# Patient Record
Sex: Male | Born: 1965 | Race: Black or African American | Hispanic: No | Marital: Married | Smoking: Never smoker
Health system: Southern US, Community
[De-identification: ages and names within clinical notes are randomized; demographics above are authoritative.]

## PROBLEM LIST (undated history)

## (undated) DIAGNOSIS — K219 Gastro-esophageal reflux disease without esophagitis: Secondary | ICD-10-CM

## (undated) DIAGNOSIS — F209 Schizophrenia, unspecified: Secondary | ICD-10-CM

## (undated) DIAGNOSIS — E119 Type 2 diabetes mellitus without complications: Secondary | ICD-10-CM

## (undated) DIAGNOSIS — I10 Essential (primary) hypertension: Secondary | ICD-10-CM

## (undated) DIAGNOSIS — E785 Hyperlipidemia, unspecified: Secondary | ICD-10-CM

## (undated) HISTORY — DX: Schizophrenia, unspecified: F20.9

## (undated) HISTORY — DX: Essential (primary) hypertension: I10

## (undated) HISTORY — DX: Hyperlipidemia, unspecified: E78.5

## (undated) HISTORY — PX: NO PAST SURGERIES: SHX2092

## (undated) HISTORY — DX: Gastro-esophageal reflux disease without esophagitis: K21.9

---

## 2009-11-25 ENCOUNTER — Emergency Department (HOSPITAL_COMMUNITY): Admission: EM | Admit: 2009-11-25 | Discharge: 2009-11-25 | Payer: Self-pay | Admitting: Family Medicine

## 2010-01-09 ENCOUNTER — Ambulatory Visit (HOSPITAL_COMMUNITY): Payer: Self-pay | Admitting: Licensed Clinical Social Worker

## 2010-01-13 ENCOUNTER — Emergency Department (HOSPITAL_COMMUNITY): Admission: EM | Admit: 2010-01-13 | Discharge: 2010-01-13 | Payer: Self-pay | Admitting: Emergency Medicine

## 2010-01-16 ENCOUNTER — Ambulatory Visit (HOSPITAL_COMMUNITY): Payer: Self-pay | Admitting: Licensed Clinical Social Worker

## 2010-01-26 ENCOUNTER — Ambulatory Visit (HOSPITAL_COMMUNITY): Payer: Self-pay | Admitting: Psychiatry

## 2010-01-30 ENCOUNTER — Encounter: Payer: Self-pay | Admitting: Physician Assistant

## 2010-01-30 ENCOUNTER — Ambulatory Visit: Payer: Self-pay | Admitting: Family Medicine

## 2010-01-30 DIAGNOSIS — I10 Essential (primary) hypertension: Secondary | ICD-10-CM | POA: Insufficient documentation

## 2010-01-30 DIAGNOSIS — E785 Hyperlipidemia, unspecified: Secondary | ICD-10-CM

## 2010-01-30 DIAGNOSIS — E669 Obesity, unspecified: Secondary | ICD-10-CM

## 2010-01-30 DIAGNOSIS — T169XXA Foreign body in ear, unspecified ear, initial encounter: Secondary | ICD-10-CM | POA: Insufficient documentation

## 2010-02-01 ENCOUNTER — Encounter: Payer: Self-pay | Admitting: Physician Assistant

## 2010-02-01 LAB — CONVERTED CEMR LAB: Ferritin: 187 ng/mL (ref 22–322)

## 2010-02-02 ENCOUNTER — Encounter: Payer: Self-pay | Admitting: Physician Assistant

## 2010-02-02 ENCOUNTER — Ambulatory Visit (HOSPITAL_COMMUNITY): Payer: Self-pay | Admitting: Psychology

## 2010-02-02 LAB — CONVERTED CEMR LAB
HDL: 46 mg/dL (ref >39)
Hemoglobin: 12.6 g/dL — ABNORMAL LOW (ref 13.0–17.0)
LDL Cholesterol: 177 mg/dL — ABNORMAL HIGH (ref 0–99)
RBC: 5.69 M/uL (ref 4.22–5.81)
RDW: 16.4 % — ABNORMAL HIGH (ref 11.5–15.5)
Triglycerides: 77 mg/dL (ref <150)
WBC: 6.2 10*3/uL (ref 4.0–10.5)

## 2010-02-06 ENCOUNTER — Encounter: Payer: Self-pay | Admitting: Physician Assistant

## 2010-02-07 LAB — CONVERTED CEMR LAB
ALT: 37 units/L (ref 0–53)
Chloride: 86 meq/L — ABNORMAL LOW (ref 96–112)
Creatinine, Ser: 1.69 mg/dL — ABNORMAL HIGH (ref 0.40–1.50)
Total Protein: 8.5 g/dL — ABNORMAL HIGH (ref 6.0–8.3)

## 2010-03-08 ENCOUNTER — Ambulatory Visit: Payer: Self-pay | Admitting: Family Medicine

## 2010-03-08 DIAGNOSIS — D649 Anemia, unspecified: Secondary | ICD-10-CM | POA: Insufficient documentation

## 2010-03-08 DIAGNOSIS — K219 Gastro-esophageal reflux disease without esophagitis: Secondary | ICD-10-CM | POA: Insufficient documentation

## 2010-03-08 DIAGNOSIS — R7309 Other abnormal glucose: Secondary | ICD-10-CM | POA: Insufficient documentation

## 2010-03-09 LAB — CONVERTED CEMR LAB
Platelets: 386 10*3/uL (ref 150–400)
WBC: 5.8 10*3/uL (ref 4.0–10.5)

## 2010-03-13 ENCOUNTER — Ambulatory Visit (HOSPITAL_COMMUNITY): Payer: Self-pay | Admitting: Psychology

## 2010-05-06 ENCOUNTER — Encounter: Payer: Self-pay | Admitting: Family Medicine

## 2010-05-06 LAB — CONVERTED CEMR LAB: OCCULT 1: NEGATIVE

## 2010-05-07 ENCOUNTER — Encounter: Payer: Self-pay | Admitting: Family Medicine

## 2010-05-07 LAB — CONVERTED CEMR LAB: OCCULT 3: NEGATIVE

## 2010-05-08 ENCOUNTER — Encounter: Payer: Self-pay | Admitting: Family Medicine

## 2010-05-08 ENCOUNTER — Ambulatory Visit: Payer: Self-pay | Admitting: Family Medicine

## 2010-05-08 LAB — CONVERTED CEMR LAB: OCCULT 2: NEGATIVE

## 2010-05-10 ENCOUNTER — Encounter: Payer: Self-pay | Admitting: Family Medicine

## 2010-06-14 ENCOUNTER — Ambulatory Visit: Admit: 2010-06-14 | Payer: Self-pay | Admitting: Family Medicine

## 2010-06-16 ENCOUNTER — Encounter: Payer: Self-pay | Admitting: Family Medicine

## 2010-06-20 NOTE — Letter (Signed)
Summary: Letter  Letter   Imported By: Lind Guest 02/06/2010 14:03:08  _____________________________________________________________________  External Attachment:    Type:   Image     Comment:   External Document

## 2010-06-20 NOTE — Assessment & Plan Note (Signed)
Summary: office visit   Vital Signs:  Patient profile:   45 year old male Height:      67.75 inches Weight:      231.75 pounds BMI:     35.63 O2 Sat:      98 % on Room air Pulse rate:   86 / minute Resp:     16 per minute BP sitting:   140 / 100  (left arm)  Vitals Entered By: Mauricia Area CMA (March 08, 2010 9:35 AM)  Serial Vital Signs/Assessments:  Time      Position  BP       Pulse  Resp  Temp     By                     132/90                         Esperanza Sheets PA  CC: follow up Comments Did not bring meds   CC:  follow up.  History of Present Illness: Pt presents today for check up.  Hx of htn. Taking medications as prescribed and denies side effects.  No headache, chest pain or palpitations. Hx of Hyperlipidemia.  Taking medications as prescribed and no side effects. Not following a low fat diet. Not exercising regularly.  Eating fried foods.  Review recent labs.  No prev hx of anemia, per pt. Pt admits to frequent fluid brash when lying down.  Normal soft stools, but not every day.  No blood or melena. Denies: Headache, or dizziness. Chest pain or discomfort, palpitations, and peripheral edema. Cough or difficulty breathing. Abd pain,  and no change in bowel habits.        Allergies (verified): No Known Drug Allergies  Past History:  Past medical history reviewed for relevance to current acute and chronic problems.  Past Medical History: Reviewed history from 01/30/2010 and no changes required. Hyperlipidemia Hypertension Schizophrenia - Behavioral Health  Review of Systems      See HPI Psych:  Complains of mental problems.  Physical Exam  General:  Well-developed,well-nourished,in no acute distress; alert,appropriate and cooperative throughout examination Head:  Normocephalic and atraumatic without obvious abnormalities. No apparent alopecia or balding. Ears:  External ear exam shows no significant lesions or deformities.  Otoscopic  examination reveals clear canals, tympanic membranes are intact bilaterally without bulging, retraction, inflammation or discharge. Hearing is grossly normal bilaterally. Nose:  External nasal examination shows no deformity or inflammation. Nasal mucosa are pink and moist without lesions or exudates. Mouth:  Oral mucosa and oropharynx without lesions or exudates.  Teeth in good repair. Neck:  No deformities, masses, or tenderness noted. Lungs:  Normal respiratory effort, chest expands symmetrically. Lungs are clear to auscultation, no crackles or wheezes. Heart:  Normal rate and regular rhythm. S1 and S2 normal without gallop, murmur, click, rub or other extra sounds. Cervical Nodes:  No lymphadenopathy noted Psych:  Cognition and judgment appear intact. Alert and cooperative with normal attention span and concentration. No apparent delusions, illusions, hallucinations   Impression & Recommendations:  Problem # 1:  HYPERTENSION (ICD-401.9) Assessment Comment Only  The following medications were removed from the medication list:    Amlodipine Besylate 10 Mg Tabs (Amlodipine besylate) ..... One tab by mouth once daily His updated medication list for this problem includes:    Hydrochlorothiazide 25 Mg Tabs (Hydrochlorothiazide) ..... One tab by mouth once daily    Amlodipine Besy-benazepril Hcl 10-20 Mg  Caps (Amlodipine besy-benazepril hcl) .Marland Kitchen... Take one daily for high blood pressure  BP today: 140/100 Prior BP: 146/96 (01/30/2010)  Labs Reviewed: K+: TNP mEq/L (02/02/2010) Creat: : 1.69 (02/02/2010)   Chol: 238 (01/30/2010)   HDL: 46 (01/30/2010)   LDL: 177 (01/30/2010)   TG: 77 (01/30/2010)  Problem # 2:  PRE-DIABETES (ICD-790.29) Assessment: New  Discussed the importance of healthy diet, exercise and wt loss for prevention of progression to DM.  Orders: T-Comprehensive Metabolic Panel [id number]) T-Lipid Profile [phone]) T- Hemoglobin A1C ([id number])  Problem # 3:   HYPERLIPIDEMIA (ICD-272.4) Assessment: Comment Only Pt restarted Simvastatin.  Encouraged low fat diet, exercise, and wt loss.    His updated medication list for this problem includes:    Simvastatin 20 Mg Tabs (Simvastatin) ..... One tab by mouth at bedtime  Labs Reviewed: SGOT: 29 (02/02/2010)   SGPT: 37 (02/02/2010)   HDL:46 (01/30/2010)  LDL:177 (01/30/2010)  Chol:238 (01/30/2010)  Trig:77 (01/30/2010)  Problem # 4:  ANEMIA (ICD-285.9) Assessment: New  Recheck CBC today.  Hemocults x 3 ordered. Consider GI consult.  Orders: T-CBC No Diff ([id number])  Problem # 5:  GERD (ICD-530.81) Assessment: New  His updated medication list for this problem includes:    Omeprazole 20 Mg Cpdr (Omeprazole) .Marland Kitchen... Take one daily, 30 minutes before a meal, for reflux  Complete Medication List: 1)  Simvastatin 20 Mg Tabs (Simvastatin) .... One tab by mouth at bedtime 2)  Hydrochlorothiazide 25 Mg Tabs (Hydrochlorothiazide) .... One tab by mouth once daily 3)  Zyprexa 5 Mg Tabs (Olanzapine) 4)  Amlodipine Besy-benazepril Hcl 10-20 Mg Caps (Amlodipine besy-benazepril hcl) .... Take one daily for high blood pressure 5)  Omeprazole 20 Mg Cpdr (Omeprazole) .... Take one daily, 30 minutes before a meal, for reflux  Patient Instructions: 1)  Please schedule a follow-up appointment in 2 months. 2)  Stop taking Amlodipine.  Start the new prescription for Amlodipine-Benazepril as prescribed. 3)  I also prescribed Omeprazole for your stomach. 4)  Have lab work drawn fasting in 2 mos before your next appt. 5)  Complete stool samples as discussed. 6)  Eat healthier.  Low fat foods, and decrease your sweets.  Eat more fruits, veggies, and lean meats. 7)  It is important that you exercise regularly at least 20 minutes 5 times a week. If you develop chest pain, have severe difficulty breathing, or feel very tired , stop exercising immediately and seek medical attention. Prescriptions: OMEPRAZOLE 20 MG  CPDR (OMEPRAZOLE) take one daily, 30 minutes before a meal, for reflux  #30 x 3   Entered and Authorized by:   Esperanza Sheets PA   Signed by:   Esperanza Sheets PA on 03/08/2010   Method used:   Electronically to        [facility]  [address] (retail)       [address]       [place], [place]  [address]       Ph: [phone]       Fax: [phone]   RxID:   [id number] AMLODIPINE BESY-BENAZEPRIL HCL 10-20 MG CAPS (AMLODIPINE BESY-BENAZEPRIL HCL) take one daily for high blood pressure  #30 x 3   Entered and Authorized by:   Esperanza Sheets PA   Signed by:   Esperanza Sheets PA on 03/08/2010   Method used:   Electronically to        [facility]  [address] (retail)  [address]       [place], [place]  [address]       Ph: [phone]       Fax: [phone]   RxID:   [id number]    Orders Added: 1)  T-CBC No Diff [[id number]] 2)  T-Comprehensive Metabolic Panel [[id number]] 3)  T-Lipid Profile [80061-22930] 4)  T- Hemoglobin A1C [83036-23375] 5)  Est. Patient Level IV [[address]]

## 2010-06-20 NOTE — Assessment & Plan Note (Signed)
Summary: new patient- room 1   Vital Signs:  Patient profile:   45 year old male Height:      67.75 inches Weight:      228.50 pounds BMI:     35.13 O2 Sat:      99 % on Room air Pulse rate:   95 / minute Resp:     16 per minute BP sitting:   146 / 96  (left arm)  Vitals Entered By: Adella Hare LPN (January 30, 2010 1:31 PM)  Nutrition Counseling: Patient's BMI is greater than 25 and therefore counseled on weight management options. CC: new patient Is Patient Diabetic? No Pain Assessment Patient in pain? no        CC:  new patient.  History of Present Illness: New pt here to establish care with new PCP.  Hx of htn. Taking medications as prescribed until ran out couple days ago and denies side effects.  No headache, chest pain or palpitations. Hx of Hyperlipidemia.  Taking medications as prescribed until ran out couple days ago and no side effects.  Not following a low fat diet. Not exercising regularly. Recently started going to behavioral health.  Recently started Zyprexa 2.5 mg, and will be tapering up to 5 mg.  Last Td - first part of 2011. Pt had hematuria in the recent past.  Had CT done and saw urologist.       Current Medications (verified): 1)  Simvastatin 20 Mg Tabs (Simvastatin) .... One Tab By Mouth At Bedtime 2)  Hydrochlorothiazide 25 Mg Tabs (Hydrochlorothiazide) .... One Tab By Mouth Once Daily 3)  Amlodipine Besylate 10 Mg Tabs (Amlodipine Besylate) .... One Tab By Mouth Once Daily  Allergies (verified): No Known Drug Allergies  Past History:  Past medical, surgical, family and social histories (including risk factors) reviewed for relevance to current acute and chronic problems.  Past Medical History: Hyperlipidemia Hypertension Schizophrenia - Behavioral Health  Past Surgical History: none  Family History: Reviewed history and no changes required. Family History Psychiatric care father unknown  Social History: Reviewed history  and no changes required. 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 ENT:  Denies earache, nasal congestion, and sore throat. CV:  Denies chest pain or discomfort and palpitations. Resp:  Denies cough and shortness of breath. GI:  Denies abdominal pain, change in bowel habits, indigestion, nausea, and vomiting. GU:  Denies dysuria, hematuria, and urinary frequency. Neuro:  Denies headaches, numbness, and tremors. Psych:  Complains of mental problems.  Physical Exam  General:  Well-developed,well-nourished,in no acute distress; alert,appropriate and cooperative throughout examination Head:  Normocephalic and atraumatic without obvious abnormalities. No apparent alopecia or balding. Ears:  Rt EAC foreign body noted.  Removed with forceps a white platic cylindrical piece.  Bilat TMs neg. Nose:  External nasal examination shows no deformity or inflammation. Nasal mucosa are pink and moist without lesions or exudates. Mouth:  Oral mucosa and oropharynx without lesions or exudates.  Teeth in good repair. Neck:  No deformities, masses, or tenderness noted. Lungs:  Normal respiratory effort, chest expands symmetrically. Lungs are clear to auscultation, no crackles or wheezes. Heart:  Normal rate and regular rhythm. S1 and S2 normal without gallop, murmur, click, rub or other extra sounds. Abdomen:  Bowel sounds positive,abdomen soft and non-tender without  masses, organomegaly or hernias noted. Cervical Nodes:  No lymphadenopathy noted Psych:  Cognition and judgment appear intact. Alert and cooperative with normal attention span and concentration. No apparent delusions, illusions, hallucinations   Impression & Recommendations:  Problem # 1:  HYPERTENSION  (ICD-401.9) Assessment Deteriorated  His updated medication list for this problem includes:    Hydrochlorothiazide 25 Mg Tabs (Hydrochlorothiazide) ..... One tab by mouth once daily    Amlodipine Besylate 10 Mg Tabs (Amlodipine besylate) ..... One tab by mouth once daily  Orders: T-CMP with estimated GFR ([id number])  Problem # 2:  HYPERLIPIDEMIA (ICD-272.4) Assessment: Comment Only  His updated medication list for this problem includes:    Simvastatin 20 Mg Tabs (Simvastatin) ..... One tab by mouth at bedtime  Orders: T-Lipid Profile ([id number])  Problem # 3:  OBESITY (ICD-278.00) Assessment: Comment Only Encouraged exercise and wt loss.   Orders: T- Hemoglobin A1C ([id number])  Ht: 67.75 (01/30/2010)   Wt: 228.50 (01/30/2010)   BMI: 35.13 (01/30/2010)  Problem # 4:  FOREIGN BODY, EAR, RIGHT (ICD-931)  Orders: Clear Outer Ear canal ([address])  Complete Medication List: 1)  Simvastatin 20 Mg Tabs (Simvastatin) .... One tab by mouth at bedtime 2)  Hydrochlorothiazide 25 Mg Tabs (Hydrochlorothiazide) .... One tab by mouth once daily 3)  Amlodipine Besylate 10 Mg Tabs (Amlodipine besylate) .... One tab by mouth once daily 4)  Zyprexa 5 Mg Tabs (Olanzapine)  Other Orders: T-CBC No Diff ([id number]) T-TSH ([id number])  Patient Instructions: 1)  Please schedule a follow-up appointment in 1 month. 2)  Have blood work drawn fasting. 3)  I have refilled your medications for you. 4)  It is important that you exercise regularly at least 20 minutes 5 times a week. If you develop chest pain, have severe difficulty breathing, or feel very tired , stop exercising immediately and seek medical attention. 5)  You need to lose weight. Consider a lower calorie diet and regular exercise.  Prescriptions: AMLODIPINE BESYLATE 10 MG TABS (AMLODIPINE BESYLATE) one tab by mouth once daily  #30 x 2   Entered and Authorized by:   Esperanza Sheets PA   Signed by:   Esperanza Sheets PA on 01/30/2010    Method used:   Electronically to        [facility]  [address] (retail)       [address]       [place], [place]  [address]       Ph: [phone]       Fax: [phone]   RxID:   [id number] HYDROCHLOROTHIAZIDE 25 MG TABS (HYDROCHLOROTHIAZIDE) one tab by mouth once daily  #30 x 2   Entered and Authorized by:   Esperanza Sheets PA   Signed by:   Esperanza Sheets PA on 01/30/2010   Method used:   Electronically to        [facility]  [address] (retail)       [address]       [place], [place]  [address]       Ph: [phone]       Fax: [phone]   RxID:   [id number] SIMVASTATIN 20 MG TABS (SIMVASTATIN) one tab by mouth at bedtime  #30 x 2   Entered and Authorized by:   Esperanza Sheets PA   Signed by:   Esperanza Sheets PA on 01/30/2010   Method used:   Electronically to  [facility]  [address] [facility] (retail)       [address]       [place], [place]  [address]       Ph: [phone]       Fax: [phone]   RxID:   [id number]

## 2010-06-22 NOTE — Letter (Signed)
Summary: 1ST MISSED LETTER  1ST MISSED LETTER   Imported By: Lind Guest 06/16/2010 08:32:25  _____________________________________________________________________  External Attachment:    Type:   Image     Comment:   External Document

## 2010-06-22 NOTE — Miscellaneous (Signed)
Summary: stool cards  Clinical Lists Changes  Observations: Added new observation of HEMOCCULT 2: negative (05/08/2010 16:04) Added new observation of HEMOCCULT 3: negative (05/07/2010 16:04) Added new observation of HEMOCCULT 1: negative (05/06/2010 16:04)     Laboratory Results  Date/Time Received: May 10, 2010 4:04 PM  Date/Time Reported: May 10, 2010 4:04 PM   Stool - Occult Blood Hemmoccult #1: negative Date: 05/06/2010 Hemoccult #2: negative Date: 05/07/2010 Hemoccult #3: negative Date: 05/08/2010

## 2010-07-19 ENCOUNTER — Telehealth (INDEPENDENT_AMBULATORY_CARE_PROVIDER_SITE_OTHER): Payer: Self-pay | Admitting: *Deleted

## 2010-08-01 NOTE — Progress Notes (Signed)
Summary: referrel  Phone Note Call from Patient   Summary of Call: Dr. Lolly Mustache has dropped him is there anyone else he can go to  call back at  [phone]  or  [phone] buisness phone Initial call taken by: Lind Guest,  July 19, 2010 12:47 PM  Follow-up for Phone Call        [facility], or any psych office in [place], pls find out where he wants to go and I am putting in the referral Follow-up by: Syliva Overman MD,  July 19, 2010 12:54 PM  Additional Follow-up for Phone Call Additional follow up Details #1::        called pt and left message on phone for him to call office back Additional Follow-up by: Rudene Anda,  July 19, 2010 1:18 PM    Additional Follow-up for Phone Call Additional follow up Details #2::    Called pt and left a message again  Follow-up by: Rudene Anda,  July 21, 2010 8:43 AM

## 2010-08-06 LAB — POCT URINALYSIS DIP (DEVICE)
Glucose, UA: NEGATIVE mg/dL
Nitrite: NEGATIVE
Urobilinogen, UA: 0.2 mg/dL (ref 0.0–1.0)

## 2010-08-07 ENCOUNTER — Encounter: Payer: Self-pay | Admitting: Family Medicine

## 2010-08-08 ENCOUNTER — Ambulatory Visit: Payer: Self-pay | Admitting: Family Medicine

## 2010-08-08 ENCOUNTER — Encounter: Payer: Self-pay | Admitting: Family Medicine

## 2010-09-12 ENCOUNTER — Encounter: Payer: Self-pay | Admitting: Family Medicine

## 2010-09-13 ENCOUNTER — Encounter: Payer: Self-pay | Admitting: Family Medicine

## 2010-09-14 ENCOUNTER — Encounter: Payer: Self-pay | Admitting: Family Medicine

## 2010-09-14 ENCOUNTER — Ambulatory Visit (INDEPENDENT_AMBULATORY_CARE_PROVIDER_SITE_OTHER): Payer: 59 | Admitting: Family Medicine

## 2010-09-14 VITALS — BP 136/90 | HR 88 | Resp 16 | Ht 68.0 in | Wt 232.0 lb

## 2010-09-14 DIAGNOSIS — K3189 Other diseases of stomach and duodenum: Secondary | ICD-10-CM

## 2010-09-14 DIAGNOSIS — I10 Essential (primary) hypertension: Secondary | ICD-10-CM

## 2010-09-14 DIAGNOSIS — R1013 Epigastric pain: Secondary | ICD-10-CM

## 2010-09-14 DIAGNOSIS — Z79899 Other long term (current) drug therapy: Secondary | ICD-10-CM

## 2010-09-14 DIAGNOSIS — R7301 Impaired fasting glucose: Secondary | ICD-10-CM

## 2010-09-14 DIAGNOSIS — E785 Hyperlipidemia, unspecified: Secondary | ICD-10-CM

## 2010-09-14 DIAGNOSIS — Z125 Encounter for screening for malignant neoplasm of prostate: Secondary | ICD-10-CM

## 2010-09-14 DIAGNOSIS — F99 Mental disorder, not otherwise specified: Secondary | ICD-10-CM

## 2010-09-14 DIAGNOSIS — R7309 Other abnormal glucose: Secondary | ICD-10-CM

## 2010-09-14 DIAGNOSIS — F489 Nonpsychotic mental disorder, unspecified: Secondary | ICD-10-CM

## 2010-09-14 MED ORDER — OMEPRAZOLE 20 MG PO CPDR: 20.0000 mg | DELAYED_RELEASE_CAPSULE | Freq: Every day | ORAL | Status: DC

## 2010-09-14 MED ORDER — AMLODIPINE BESY-BENAZEPRIL HCL 10-20 MG PO CAPS: 1.0000 | ORAL_CAPSULE | Freq: Every day | ORAL | Status: DC

## 2010-09-14 MED ORDER — HYDROCHLOROTHIAZIDE 25 MG PO TABS: 25.0000 mg | ORAL_TABLET | Freq: Every day | ORAL | Status: DC

## 2010-09-14 MED ORDER — PRAVASTATIN SODIUM 40 MG PO TABS: 40.0000 mg | ORAL_TABLET | Freq: Every evening | ORAL | Status: DC

## 2010-09-14 NOTE — Patient Instructions (Signed)
F/u in 2 months.  A healthy diet is rich in fruit, vegetables and whole grains. Poultry fish, nuts and beans are a healthy choice for protein rather then red meat. A low sodium diet and drinking 64 ounces of water daily is generally recommended. Oils and sweet should be limited. Carbohydrates especially for those who are diabetic or overweight, should be limited to 34-45 gram per meal. It is important to eat on a regular schedule, at least 3 times daily. Snacks should be primarily fruits, vegetables or nuts. It is important that you exercise regularly at least 30 minutes 5 times a week. If you develop chest pain, have severe difficulty breathing, or feel very tired, stop exercising immediately and seek medical attention  Fasting labs in 2 months.  EKG, CCUA today.  TdAP today  Meds as discussed pls take as prescribed

## 2010-09-15 LAB — POCT URINALYSIS DIPSTICK
Blood, UA: NEGATIVE
Nitrite, UA: NEGATIVE
Protein, UA: NEGATIVE
WBC, UA: NEGATIVE
pH, UA: 5.5

## 2010-09-18 ENCOUNTER — Encounter: Payer: Self-pay | Admitting: Family Medicine

## 2010-09-25 ENCOUNTER — Encounter: Payer: Self-pay | Admitting: Family Medicine

## 2010-09-25 DIAGNOSIS — F99 Mental disorder, not otherwise specified: Secondary | ICD-10-CM | POA: Insufficient documentation

## 2010-09-25 NOTE — Assessment & Plan Note (Signed)
Deteriorate by history , will refer for psych eval and treatment, not currently a threat to self and anyone

## 2010-09-25 NOTE — Assessment & Plan Note (Signed)
Medication compliance addressed. Commitment to regular exercise and healthy  food choices, with portion control discussed. DASH diet and low fat diet discussed and literature offered. Changes in medication made at this visit.  

## 2010-09-25 NOTE — Progress Notes (Signed)
  Subjective:    Patient ID: Alex Holt, male    DOB: Nov 03, 1965, 45 y.o.   MRN: [id number]  HPI The PT is here for follow up and re-evaluation of chronic medical conditions and  medication management  There are no new concerns.He does request referral for mental health illness. He is not suicidal or homicidal, but states he has a personal and fam h/o mental illness and is aware he needs medciation  There are no specific complaints       Review of Systems Denies recent fever or chills. Denies sinus pressure, nasal congestion, ear pain or sore throat. Denies chest congestion, productive cough or wheezing. Denies chest pains, palpitations, paroxysmal nocturnal dyspnea, orthopnea and leg swelling Denies abdominal pain, nausea, vomiting,diarrhea or constipation.  Denies rectal bleeding or change in bowel movement. Denies dysuria, frequency, hesitancy or incontinence. Denies joint pain, swelling and limitation in mobility. Denies headaches, seizure, numbness, or tingling.  Denies skin break down or rash.        Objective:   Physical Exam Patient alert and oriented and in no Cardiopulmonary distress.  HEENT: No facial asymmetry, EOMI, no sinus tenderness, TM's clear, Oropharynx pink and moist.  Neck supple no adenopathy.  Chest: Clear to auscultation bilaterally.  CVS: S1, S2 no murmurs, no S3.  ABD: Soft non tender. Bowel sounds normal.  Ext: No edema  MS: Adequate ROM spine, shoulders, hips and knees.  Skin: Intact, no ulcerations or rash noted.  Psych: Good eye contact, . Memory intact not anxious or depressed appearing.  CNS: CN 2-12 intact, power, tone and sensation normal throughout.        Assessment & Plan:

## 2010-09-25 NOTE — Assessment & Plan Note (Signed)
The importance of dietary change , reg exercise to reduce the risk of developing diabetes was dicussed and stressed

## 2010-09-25 NOTE — Assessment & Plan Note (Signed)
Uncontrolled, low fat diet discussed, labs to be updated

## 2010-11-13 ENCOUNTER — Encounter: Payer: Self-pay | Admitting: Family Medicine

## 2010-11-14 ENCOUNTER — Ambulatory Visit (INDEPENDENT_AMBULATORY_CARE_PROVIDER_SITE_OTHER): Payer: 59 | Admitting: Family Medicine

## 2010-11-14 ENCOUNTER — Encounter: Payer: Self-pay | Admitting: Family Medicine

## 2010-11-14 VITALS — BP 120/88 | HR 111 | Resp 16 | Ht 67.0 in | Wt 229.0 lb

## 2010-11-14 DIAGNOSIS — E785 Hyperlipidemia, unspecified: Secondary | ICD-10-CM

## 2010-11-14 DIAGNOSIS — G4700 Insomnia, unspecified: Secondary | ICD-10-CM | POA: Insufficient documentation

## 2010-11-14 DIAGNOSIS — F99 Mental disorder, not otherwise specified: Secondary | ICD-10-CM

## 2010-11-14 DIAGNOSIS — E1169 Type 2 diabetes mellitus with other specified complication: Secondary | ICD-10-CM

## 2010-11-14 DIAGNOSIS — I10 Essential (primary) hypertension: Secondary | ICD-10-CM

## 2010-11-14 DIAGNOSIS — E119 Type 2 diabetes mellitus without complications: Secondary | ICD-10-CM

## 2010-11-14 DIAGNOSIS — A048 Other specified bacterial intestinal infections: Secondary | ICD-10-CM

## 2010-11-14 DIAGNOSIS — F489 Nonpsychotic mental disorder, unspecified: Secondary | ICD-10-CM

## 2010-11-14 DIAGNOSIS — E669 Obesity, unspecified: Secondary | ICD-10-CM

## 2010-11-14 LAB — LIPID PANEL
Cholesterol: 216 mg/dL — ABNORMAL HIGH (ref 0–200)
LDL Cholesterol: 143 mg/dL — ABNORMAL HIGH (ref 0–99)
Total CHOL/HDL Ratio: 3.5 Ratio
VLDL: 12 mg/dL (ref 0–40)

## 2010-11-14 LAB — BASIC METABOLIC PANEL
CO2: 24 mEq/L (ref 19–32)
Calcium: 9.9 mg/dL (ref 8.4–10.5)
Chloride: 100 mEq/L (ref 96–112)
Glucose, Bld: 91 mg/dL (ref 70–99)
Potassium: 4.6 mEq/L (ref 3.5–5.3)
Sodium: 138 mEq/L (ref 135–145)

## 2010-11-14 LAB — HEPATIC FUNCTION PANEL
AST: 24 U/L (ref 0–37)
Albumin: 4.4 g/dL (ref 3.5–5.2)
Alkaline Phosphatase: 58 U/L (ref 39–117)
Total Protein: 7.6 g/dL (ref 6.0–8.3)

## 2010-11-14 LAB — H. PYLORI ANTIBODY, IGG: H Pylori IgG: >8 {ISR} — ABNORMAL HIGH

## 2010-11-14 MED ORDER — METFORMIN HCL 500 MG PO TABS: 500.0000 mg | ORAL_TABLET | Freq: Two times a day (BID) | ORAL | Status: DC

## 2010-11-14 MED ORDER — PRAVASTATIN SODIUM 80 MG PO TABS: 80.0000 mg | ORAL_TABLET | Freq: Every evening | ORAL | Status: DC

## 2010-11-14 MED ORDER — TEMAZEPAM 15 MG PO CAPS: 15.0000 mg | ORAL_CAPSULE | Freq: Every evening | ORAL | Status: AC | PRN

## 2010-11-14 MED ORDER — GLUCOSE BLOOD VI STRP: ORAL_STRIP | Status: AC

## 2010-11-14 MED ORDER — ACCU-CHEK MULTICLIX LANCETS MISC: Status: AC

## 2010-11-14 NOTE — Patient Instructions (Signed)
F/u in 3 months.  You are now a diabetic, start metformin 500mg  one twice daily. You need to start testing your sugar once daily, pls gt to group class and also we will give you a meter.   The pravastatin dose is being increased to 80mg  one at bedtime, since your cholesterl is still too high. oK to take TWO 40mg  tabs of this till done  You will be referred to mental health.  New medication at bedtime to help with sleep

## 2010-11-17 MED ORDER — AMOXICILL-CLARITHRO-LANSOPRAZ PO MISC: ORAL | Status: DC

## 2010-11-17 NOTE — Progress Notes (Signed)
Addended by: Abner Greenspan on: 11/17/2010 01:52 PM   Modules accepted: Orders

## 2010-11-19 NOTE — Progress Notes (Signed)
  Subjective:    Patient ID: Alex Holt, male    DOB: September 14, 1965, 45 y.o.   MRN: [id number]  HPI The PT is here for follow up and re-evaluation of chronic medical conditions, medication management and review of recent lab and radiology data.  Preventive health is updated, specifically  Cancer screening, Osteoporosis screening and Immunization.   Questions or concerns regarding consultations or procedures which the PT has had in the interim are  addressed. The PT denies any adverse reactions to current medications since the last visit.  He still hs not been seen by psychiatry and wants this done since there is a family h/o mental illness, and he believes he also has some degree of illness. He c/o significant problems falling and staying asleep despite education re good sleep hygiene , requests medication for this    Review of Systems Denies recent fever or chills. Denies sinus pressure, nasal congestion, ear pain or sore throat. Denies chest congestion, productive cough or wheezing. Denies chest pains, palpitations, paroxysmal nocturnal dyspnea, orthopnea and leg swelling Intermittent abddominal pain, denies  nausea, vomiting,diarrhea or constipation.  Denies rectal bleeding or change in bowel movement. Denies dysuria, frequency, hesitancy or incontinence. Denies joint pain, swelling and limitation in mobility. Denies headaches, seizure, numbness, or tingling. . Denies skin break down or rash.        Objective:   Physical Exam Patient alert and oriented and in no Cardiopulmonary distress.  HEENT: No facial asymmetry, EOMI, no sinus tenderness, TM's clear, Oropharynx pink and moist.  Neck supple no adenopathy.  Chest: Clear to auscultation bilaterally.  CVS: S1, S2 no murmurs, no S3.  ABD: Soft non tender. Bowel sounds normal.  Ext: No edema  MS: Adequate ROM spine, shoulders, hips and knees.  Skin: Intact, no ulcerations or rash noted.  Psych: Good eye contact, normal   affect. Memory intact. CNS: CN 2-12 intact, power, tone and sensation normal throughout.        Assessment & Plan:

## 2010-11-20 DIAGNOSIS — A048 Other specified bacterial intestinal infections: Secondary | ICD-10-CM | POA: Insufficient documentation

## 2010-11-20 NOTE — Assessment & Plan Note (Signed)
Low fat diet discussed and pt to take med

## 2010-11-20 NOTE — Assessment & Plan Note (Signed)
Deteriorated, needs to start medication

## 2010-11-20 NOTE — Assessment & Plan Note (Signed)
Untreated and uncontrolled, pt not suicidal or homicidal, wants to see psych, will need to schedule his appt

## 2010-11-20 NOTE — Assessment & Plan Note (Signed)
Lab report came in after visit, pt notified and med prescribed

## 2010-11-20 NOTE — Assessment & Plan Note (Signed)
Unchanged, pt to start medication

## 2011-02-09 ENCOUNTER — Encounter: Payer: Self-pay | Admitting: Family Medicine

## 2011-02-12 ENCOUNTER — Encounter: Payer: Self-pay | Admitting: Family Medicine

## 2011-02-12 ENCOUNTER — Ambulatory Visit: Payer: 59 | Admitting: Family Medicine

## 2012-04-07 ENCOUNTER — Emergency Department (INDEPENDENT_AMBULATORY_CARE_PROVIDER_SITE_OTHER)
Admission: EM | Admit: 2012-04-07 | Discharge: 2012-04-07 | Disposition: A | Discharge status: Home / Self Care | Payer: 59 | Source: Home / Self Care | Attending: Family Medicine | Admitting: Family Medicine

## 2012-04-07 ENCOUNTER — Encounter (HOSPITAL_COMMUNITY): Payer: Self-pay | Admitting: Emergency Medicine

## 2012-04-07 ENCOUNTER — Emergency Department (INDEPENDENT_AMBULATORY_CARE_PROVIDER_SITE_OTHER): Payer: 59

## 2012-04-07 DIAGNOSIS — M79673 Pain in unspecified foot: Secondary | ICD-10-CM

## 2012-04-07 DIAGNOSIS — M79609 Pain in unspecified limb: Secondary | ICD-10-CM

## 2012-04-07 DIAGNOSIS — M722 Plantar fascial fibromatosis: Secondary | ICD-10-CM

## 2012-04-07 HISTORY — DX: Type 2 diabetes mellitus without complications: E11.9

## 2012-04-07 MED ORDER — ACYCLOVIR 400 MG PO TABS: 400.0000 mg | ORAL_TABLET | Freq: Three times a day (TID) | ORAL | Status: DC

## 2012-04-07 MED ORDER — TRAMADOL HCL 50 MG PO TABS: 50.0000 mg | ORAL_TABLET | Freq: Four times a day (QID) | ORAL | Status: DC | PRN

## 2012-04-07 NOTE — ED Provider Notes (Signed)
History     CSN: [id number]  Arrival date & time 04/07/12  1427   None     Chief Complaint  Patient presents with  . Foot Pain    (Consider location/radiation/quality/duration/timing/severity/associated sxs/prior treatment) Patient is a 46 y.o. male presenting with lower extremity pain and mouth sores. The history is provided by the patient.  Foot Pain This is a recurrent problem. The current episode started more than 1 week ago. The problem occurs daily. The problem has been gradually worsening. The symptoms are aggravated by walking. Nothing relieves the symptoms. He has tried nothing for the symptoms.  Mouth Lesions  The current episode started more than 2 weeks ago. The onset was gradual. The problem occurs frequently. Progression since onset: waxing and waning. The problem is mild. The symptoms are relieved by one or more prescription drugs (taking someone elses valtrex). Nothing aggravates the symptoms. Associated symptoms include mouth sores. Pertinent negatives include no fever. He has been eating and drinking normally. There were no sick contacts. He has received no recent medical care.    Past Medical History  Diagnosis Date  . Hyperlipidemia   . Hypertension   . Schizophrenia   . Diabetes mellitus without complication     History reviewed. No pertinent past surgical history.  Family History  Problem Relation Age of Onset  . Mental illness      FAMILY HISTORY   . Mental illness Mother     History  Substance Use Topics  . Smoking status: Never Smoker   . Smokeless tobacco: Not on file  . Alcohol Use: Not on file     Comment: WEEKENDS       Review of Systems  Constitutional: Negative for fever.  HENT: Positive for mouth sores.   Musculoskeletal: Positive for arthralgias.  All other systems reviewed and are negative.    Allergies  Review of patient's allergies indicates no known allergies.  Home Medications   Current Outpatient Rx  Name  Route   Sig  Dispense  Refill  . ACYCLOVIR 400 MG PO TABS   Oral   Take 1 tablet (400 mg total) by mouth 3 (three) times daily. For seven days, then repeat with each occurrence.   50 tablet   1   . AMLODIPINE BESY-BENAZEPRIL HCL 10-20 MG PO CAPS   Oral   Take 1 capsule by mouth daily.   30 capsule   3   . AMOXICILL-CLARITHRO-LANSOPRAZ PO MISC      Follow package directions.   1 kit   0   . HYDROCHLOROTHIAZIDE 25 MG PO TABS   Oral   Take 1 tablet (25 mg total) by mouth daily. TAKE ONE TABLET DAILY FOR HIGH BLOOD PRESSURE   30 tablet   3   . METFORMIN HCL 500 MG PO TABS   Oral   Take 1 tablet (500 mg total) by mouth 2 (two) times daily with a meal.   60 tablet   11   . OMEPRAZOLE 20 MG PO CPDR   Oral   Take 1 capsule (20 mg total) by mouth daily. TAKE ONE TABLET DAILY, 30 MINUTES BEFORE A MEAL, FOR REFLUX   30 capsule   3   . PRAVASTATIN SODIUM 80 MG PO TABS   Oral   Take 1 tablet (80 mg total) by mouth every evening. Dose increase   30 tablet   4   . TRAMADOL HCL 50 MG PO TABS   Oral   Take 1 tablet (50  mg total) by mouth every 6 (six) hours as needed for pain.   30 tablet   0     BP 164/98  Pulse 70  Temp 98.2 F (36.8 C) (Oral)  Resp 18  SpO2 100%  Physical Exam  Nursing note and vitals reviewed. Constitutional: He is oriented to person, place, and time. Vital signs are normal. He appears well-developed and well-nourished. He is active and cooperative.  HENT:  Head: Normocephalic.  Mouth/Throat: Uvula is midline, oropharynx is clear and moist and mucous membranes are normal. No oral lesions.  Eyes: Conjunctivae normal are normal. Pupils are equal, round, and reactive to light. No scleral icterus.  Neck: Trachea normal and normal range of motion. Neck supple.  Cardiovascular: Normal rate and regular rhythm.   Pulmonary/Chest: Effort normal and breath sounds normal.  Musculoskeletal:       Right lower leg: Normal.       Right foot: He exhibits tenderness.  He exhibits normal range of motion, no bony tenderness, no swelling, normal capillary refill, no crepitus, no deformity and no laceration.       Left foot: Normal.       Feet:  Lymphadenopathy:    He has no cervical adenopathy.  Neurological: He is alert and oriented to person, place, and time. No cranial nerve deficit or sensory deficit.  Skin: Skin is warm and dry.  Psychiatric: He has a normal mood and affect. His speech is normal and behavior is normal. Judgment and thought content normal. Cognition and memory are normal.    ED Course  Procedures (including critical care time)   Labs Reviewed  HSV 2 ANTIBODY, IGG  HSV 1 ANTIBODY, IGG   Dg Foot Complete Right  04/07/2012  *RADIOLOGY REPORT*  Clinical Data: Right hip pain for 1 week.  No injury.  RIGHT FOOT COMPLETE - 3+ VIEW  Comparison: None.  Findings: There is no evidence of fracture or dislocation.  There is no evidence of arthropathy or other focal bony abnormality. Soft tissues are unremarkable. Slight plantar spur. Early Achilles spur.  IMPRESSION: Slight plantar spur. Early Achilles spur.  No acute fracture or dislocation.   Original Report Authenticated By: Davonna Belling, M.D.      1. Plantar fasciitis of right foot   2. Heel pain       MDM  Medications as prescribed, heel support, follow up with primary care provider.         Johnsie Kindred, NP 04/07/12 1723

## 2012-04-07 NOTE — ED Notes (Addendum)
Pt here for multiple complaints: Pt c/o heel pain for about a week that has not gotten better. Pt states he has no knowledge of any injury. Pt states it hurts worse in the AM, and heat helped relieve the pain some. Pt did not take any OTC meds to see if it would help. Pt also c/o recurrent cold sores around his mouth.

## 2012-04-08 LAB — HSV 2 ANTIBODY, IGG: HSV 2 Glycoprotein G Ab, IgG: 10.86 IV — ABNORMAL HIGH

## 2012-04-08 LAB — HSV 1 ANTIBODY, IGG: HSV 1 Glycoprotein G Ab, IgG: 10.55 IV — ABNORMAL HIGH

## 2012-04-08 NOTE — ED Provider Notes (Signed)
Medical screening examination/treatment/procedure(s) were performed by resident physician or non-physician practitioner and as supervising physician I was immediately available for consultation/collaboration.   Barkley Bruns MD.    Linna Hoff, MD 04/08/12 1045

## 2012-04-10 ENCOUNTER — Other Ambulatory Visit: Payer: Self-pay | Admitting: Family Medicine

## 2012-04-10 ENCOUNTER — Encounter: Payer: Self-pay | Admitting: Family Medicine

## 2012-04-10 ENCOUNTER — Ambulatory Visit (INDEPENDENT_AMBULATORY_CARE_PROVIDER_SITE_OTHER): Payer: 59 | Admitting: Family Medicine

## 2012-04-10 VITALS — BP 144/86 | HR 88 | Resp 18 | Ht 67.0 in | Wt 225.0 lb

## 2012-04-10 DIAGNOSIS — E119 Type 2 diabetes mellitus without complications: Secondary | ICD-10-CM

## 2012-04-10 DIAGNOSIS — I10 Essential (primary) hypertension: Secondary | ICD-10-CM

## 2012-04-10 DIAGNOSIS — E1169 Type 2 diabetes mellitus with other specified complication: Secondary | ICD-10-CM

## 2012-04-10 DIAGNOSIS — E785 Hyperlipidemia, unspecified: Secondary | ICD-10-CM

## 2012-04-10 DIAGNOSIS — M722 Plantar fascial fibromatosis: Secondary | ICD-10-CM

## 2012-04-10 DIAGNOSIS — E669 Obesity, unspecified: Secondary | ICD-10-CM

## 2012-04-10 LAB — COMPREHENSIVE METABOLIC PANEL
ALT: 15 U/L (ref 0–53)
Albumin: 4.5 g/dL (ref 3.5–5.2)
CO2: 25 mEq/L (ref 19–32)
Calcium: 10 mg/dL (ref 8.4–10.5)
Chloride: 103 mEq/L (ref 96–112)
Creat: 1 mg/dL (ref 0.50–1.35)
Potassium: 4.2 mEq/L (ref 3.5–5.3)
Sodium: 138 mEq/L (ref 135–145)
Total Protein: 7.2 g/dL (ref 6.0–8.3)

## 2012-04-10 LAB — CBC
MCHC: 32.5 g/dL (ref 30.0–36.0)
Platelets: 360 10*3/uL (ref 150–400)
RDW: 15.2 % (ref 11.5–15.5)
WBC: 5.9 10*3/uL (ref 4.0–10.5)

## 2012-04-10 LAB — HEMOGLOBIN A1C
Hgb A1c MFr Bld: 6.2 % — ABNORMAL HIGH (ref <5.7)
Mean Plasma Glucose: 131 mg/dL — ABNORMAL HIGH (ref <117)

## 2012-04-10 LAB — LIPID PANEL: Cholesterol: 225 mg/dL — ABNORMAL HIGH (ref 0–200)

## 2012-04-10 MED ORDER — HYDROCHLOROTHIAZIDE 25 MG PO TABS: 25.0000 mg | ORAL_TABLET | Freq: Every day | ORAL | Status: DC

## 2012-04-10 NOTE — Assessment & Plan Note (Signed)
Check FLP then decide on statin therapy

## 2012-04-10 NOTE — Assessment & Plan Note (Addendum)
Advised heel cups, he would like referral to ortho per ER, refer to Dr. Romeo Apple at request Advised NSAIDS, ICE RUB

## 2012-04-10 NOTE — Assessment & Plan Note (Signed)
Restart HCTZ once a day, check fasting labs

## 2012-04-10 NOTE — Progress Notes (Signed)
  Subjective:    Patient ID: Alex Holt, male    DOB: 1965-08-04, 46 y.o.   MRN: [id number]  HPI  Patient here to reestablish care. She's not been seen since June of 2012. He has a history of hypertension, hyperlipidemia and diabetes mellitus. He's only been taking hydrochlorothiazide at random times this is was last prescribed him over a year ago with only 3 refills. He was seen in the emergency room recently for plantar fasciitis and needs a referral to orthopedic surgery. 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 He denies any chest pain, dizziness, headache. He was concerned about his insurance going up from having comorbidities as well as side effects medications which is why he did not followup on a routine basis.  Review of Systems  GEN- denies fatigue, fever, weight loss,weakness, recent illness HEENT- denies eye drainage, change in vision, nasal discharge, CVS- denies chest pain, palpitations RESP- denies SOB, cough, wheeze ABD- denies N/V, change in stools, abd pain GU- denies dysuria, hematuria, dribbling, incontinence MSK- denies joint pain, muscle aches, injury, +heel pain Neuro- denies headache, dizziness, syncope, seizure activity      Objective:   Physical Exam GEN- NAD, alert and oriented x3 , repeat 152/90,obese HEENT- PERRL, EOMI, non injected sclera, pink conjunctiva, MMM, oropharynx clear Neck- Supple, no thryomegaly CVS- RRR, no murmur RESP-CTAB ABD-NABS,soft,NT,ND EXT- No edema Pulses- Radial, DP- 2+ Psych-normal affect and mood       Assessment & Plan:

## 2012-04-10 NOTE — Assessment & Plan Note (Signed)
Pt took metformin x 1 month, check A1C to decide treatment, he has some adversity to meds due to potential side effects

## 2012-04-10 NOTE — Assessment & Plan Note (Signed)
Discussed diet and exercise 

## 2012-04-10 NOTE — Patient Instructions (Addendum)
Get the fasting labs  Start the hydrochlorothiazide for your blood pressure Work on the weight loss , this will help your overall health Start aleve twice a day as needed  Use the heel cups Use ice  REferral to Dr. Romeo Apple  F/U Monday for blood work and blood pressure

## 2012-04-11 ENCOUNTER — Telehealth: Payer: Self-pay | Admitting: Family Medicine

## 2012-04-11 ENCOUNTER — Other Ambulatory Visit: Payer: Self-pay

## 2012-04-11 MED ORDER — HYDROCHLOROTHIAZIDE 25 MG PO TABS: 25.0000 mg | ORAL_TABLET | Freq: Every day | ORAL | Status: DC

## 2012-04-11 NOTE — Telephone Encounter (Signed)
Orders written for heel cups

## 2012-04-11 NOTE — Telephone Encounter (Signed)
Do you anything about arches that were supposed to be sent in to [facility]?

## 2012-04-14 ENCOUNTER — Ambulatory Visit (INDEPENDENT_AMBULATORY_CARE_PROVIDER_SITE_OTHER): Payer: 59 | Admitting: Family Medicine

## 2012-04-14 ENCOUNTER — Encounter: Payer: Self-pay | Admitting: Family Medicine

## 2012-04-14 ENCOUNTER — Telehealth (HOSPITAL_COMMUNITY): Payer: Self-pay | Admitting: *Deleted

## 2012-04-14 VITALS — BP 130/82 | HR 90 | Resp 15 | Ht 67.0 in | Wt 226.0 lb

## 2012-04-14 DIAGNOSIS — E1169 Type 2 diabetes mellitus with other specified complication: Secondary | ICD-10-CM

## 2012-04-14 DIAGNOSIS — D649 Anemia, unspecified: Secondary | ICD-10-CM

## 2012-04-14 DIAGNOSIS — I10 Essential (primary) hypertension: Secondary | ICD-10-CM

## 2012-04-14 DIAGNOSIS — E669 Obesity, unspecified: Secondary | ICD-10-CM

## 2012-04-14 DIAGNOSIS — E119 Type 2 diabetes mellitus without complications: Secondary | ICD-10-CM

## 2012-04-14 DIAGNOSIS — E785 Hyperlipidemia, unspecified: Secondary | ICD-10-CM

## 2012-04-14 MED ORDER — METFORMIN HCL ER 500 MG PO TB24: 500.0000 mg | ORAL_TABLET | Freq: Every day | ORAL | Status: DC

## 2012-04-14 MED ORDER — SIMVASTATIN 20 MG PO TABS: 20.0000 mg | ORAL_TABLET | Freq: Every evening | ORAL | Status: DC

## 2012-04-14 NOTE — Assessment & Plan Note (Addendum)
Noted in chart microcytic anemia, will add iron panel, advised to eat iron rich foods, no colonoscopy history, no bleeding

## 2012-04-14 NOTE — ED Notes (Signed)
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  Alex Holt has reviewed the results and said she treated with Acyclovir.  Just notify pt.  I called and left a message to call. Vassie Moselle 04/14/2012

## 2012-04-14 NOTE — Assessment & Plan Note (Signed)
Goal LDL < 100, start zocor 20mg  at bedtime

## 2012-04-14 NOTE — Assessment & Plan Note (Signed)
Much improved, continue HCTZ 

## 2012-04-14 NOTE — Patient Instructions (Signed)
Blood pressure looks good continue HCTZ For your diabetes take the metformin once a day  New cholesterol medication at bedtime We will call about referral to Dr. Romeo Apple  F/U 3 months

## 2012-04-14 NOTE — Assessment & Plan Note (Signed)
A1C actually looks pretty good, will have him continue metformin 500mg  XL, he is dedicated to improving his health and losing weight He will need ACEI at next visit, I am trying to limit starting too many meds as he was non compliant before and felt overwhelmed and skeptic about meds

## 2012-04-14 NOTE — Progress Notes (Signed)
  Subjective:    Patient ID: Alex Holt, male    DOB: 1966/01/17, 46 y.o.   MRN: [id number]  HPI Patient here to followup labs and blood pressure. He was seen last week after reestablishing care. He was restarted on hydrochlorothiazide 25 mg once a day has not had any problems with this. His labs were reviewed.   Review of Systems - per above      Objective:   Physical Exam GEN-NAD,alert and oriented x3       Assessment & Plan:

## 2012-04-15 ENCOUNTER — Telehealth (HOSPITAL_COMMUNITY): Payer: Self-pay | Admitting: *Deleted

## 2012-04-15 NOTE — ED Notes (Signed)
Pt. called back.  Pt. verified x 2 and given results.  Pt. told he was adequately treated with Acyclovir.  Pt. instructed to notify his partner. You can pass the virus even when you don't have an outbreak, so always practice safe sex. Get treated for each outbreak with Acyclovir or Valtrex. You may want to get a PCP who can call in a prescription for you when you have an outbreak or get a 1 yr. Rx. that he can refill as needed.  Pt. said he does have a PCP. Pt. instructed to notify him.  Pt. voiced understanding. Vassie Moselle 04/15/2012

## 2012-04-22 LAB — ANEMIA PANEL
ABS Retic: 49.2 10*3/uL (ref 19.0–186.0)
RBC.: 5.47 MIL/uL (ref 4.22–5.81)
Retic Ct Pct: 0.9 % (ref 0.4–2.3)
TIBC: 420 ug/dL (ref 215–435)
UIBC: 374 ug/dL (ref 125–400)

## 2012-04-24 ENCOUNTER — Telehealth: Payer: Self-pay | Admitting: Family Medicine

## 2012-04-29 ENCOUNTER — Ambulatory Visit (INDEPENDENT_AMBULATORY_CARE_PROVIDER_SITE_OTHER): Payer: 59 | Admitting: Orthopedic Surgery

## 2012-04-29 ENCOUNTER — Encounter: Payer: Self-pay | Admitting: Orthopedic Surgery

## 2012-04-29 VITALS — BP 180/110 | Ht 66.0 in | Wt 228.0 lb

## 2012-04-29 DIAGNOSIS — G5600 Carpal tunnel syndrome, unspecified upper limb: Secondary | ICD-10-CM

## 2012-04-29 DIAGNOSIS — M722 Plantar fascial fibromatosis: Secondary | ICD-10-CM

## 2012-04-29 MED ORDER — TRAMADOL HCL 50 MG PO TABS: 50.0000 mg | ORAL_TABLET | Freq: Four times a day (QID) | ORAL | Status: DC | PRN

## 2012-04-29 NOTE — Progress Notes (Signed)
Patient ID: Alex Holt, male   DOB: July 31, 1965, 46 y.o.   MRN: [id number] Chief Complaint  Patient presents with  . Foot Pain    Right foot pain no injury   46 year old male evaluated at urgent care for RIGHT heel pain, treated with Ultram, and change in shoe wear. Most of his symptoms seemed to be better at this point. He did have throbbing, and 10, constant pain with swelling in the bottom of his heel. No trauma.  Review of systems snoring, and heartburn, swelling, and stiffness of the joints, numbness in the RIGHT hand are relieved by placing the arm below horizontal level mainly at night  Past Medical History  Diagnosis Date  . Hyperlipidemia   . Hypertension   . Schizophrenia   . Diabetes mellitus without complication   . Acid reflux     Physical Exam(12)  Vital signs:   GENERAL: normal development   CDV: pulses are normal   Skin: normal  Lymph: nodes were not palpable/normal  Psychiatric: awake, alert and oriented  Neuro: normal sensation  MSK  Gait: ambulation. He is normal 1 Inspection Mild tenderness in the plantar aspect of the foot with full range of motion normal motor function and no instability.  Upper extremities without contracture, subluxation, atrophy, or tremor Imaging Urgent care films, show some mild midfoot arthrosis and mild plantar spur  Assessment:  #1. Plantar fasciitis, resolving #2 carpal tunnel syndrome    Plan:  #1Stretching exercises, seems to be getting better continue to improve shoe wear #2 carpal tunnel splint Followup as needed

## 2012-04-29 NOTE — Patient Instructions (Addendum)
Plantar Fasciitis (Heel Spur Syndrome) with Rehab The plantar fascia is a fibrous, ligament-like, soft-tissue structure that spans the bottom of the foot. Plantar fasciitis is a condition that causes pain in the foot due to inflammation of the tissue. SYMPTOMS    Pain and tenderness on the underneath side of the foot.   Pain that worsens with standing or walking.  CAUSES   Plantar fasciitis is caused by irritation and injury to the plantar fascia on the underneath side of the foot. Common mechanisms of injury include:  Direct trauma to bottom of the foot.   Damage to a small nerve that runs under the foot where the main fascia attaches to the heel bone.   Stress placed on the plantar fascia due to bone spurs.  RISK INCREASES WITH:    Activities that place stress on the plantar fascia (running, jumping, pivoting, or cutting).   Poor strength and flexibility.   Improperly fitted shoes.   Tight calf muscles.   Flat feet.   Failure to warm-up properly before activity.   Obesity.  PREVENTION  Warm up and stretch properly before activity.   Allow for adequate recovery between workouts.   Maintain physical fitness:   Strength, flexibility, and endurance.   Cardiovascular fitness.   Maintain a health body weight.   Avoid stress on the plantar fascia.   Wear properly fitted shoes, including arch supports for individuals who have flat feet.  PROGNOSIS   If treated properly, then the symptoms of plantar fasciitis usually resolve without surgery. However, occasionally surgery is necessary. RELATED COMPLICATIONS    Recurrent symptoms that may result in a chronic condition.   Problems of the lower back that are caused by compensating for the injury, such as limping.   Pain or weakness of the foot during push-off following surgery.   Chronic inflammation, scarring, and partial or complete fascia tear, occurring more often from repeated injections.  TREATMENT   Treatment  initially involves the use of ice and medication to help reduce pain and inflammation. The use of strengthening and stretching exercises may help reduce pain with activity, especially stretches of the Achilles tendon. These exercises may be performed at home or with a therapist. Your caregiver may recommend that you use heel cups of arch supports to help reduce stress on the plantar fascia. Occasionally, corticosteroid injections are given to reduce inflammation. If symptoms persist for greater than 6 months despite non-surgical (conservative), then surgery may be recommended.   MEDICATION    If pain medication is necessary, then nonsteroidal anti-inflammatory medications, such as aspirin and ibuprofen, or other minor pain relievers, such as acetaminophen, are often recommended.   Do not take pain medication within 7 days before surgery.   Prescription pain relievers may be given if deemed necessary by your caregiver. Use only as directed and only as much as you need.   Corticosteroid injections may be given by your caregiver. These injections should be reserved for the most serious cases, because they may only be given a certain number of times.  HEAT AND COLD  Cold treatment (icing) relieves pain and reduces inflammation. Cold treatment should be applied for 10 to 15 minutes every 2 to 3 hours for inflammation and pain and immediately after any activity that aggravates your symptoms. Use ice packs or massage the area with a piece of ice (ice massage).   Heat treatment may be used prior to performing the stretching and strengthening activities prescribed by your caregiver, physical therapist, or athletic  trainer. Use a heat pack or soak the injury in warm water.  SEEK IMMEDIATE MEDICAL CARE IF:  Treatment seems to offer no benefit, or the condition worsens.   Any medications produce adverse side effects.  EXERCISES RANGE OF MOTION (ROM) AND STRETCHING EXERCISES - Plantar Fasciitis (Heel Spur  Syndrome) These exercises may help you when beginning to rehabilitate your injury. Your symptoms may resolve with or without further involvement from your physician, physical therapist or athletic trainer. While completing these exercises, remember:    Restoring tissue flexibility helps normal motion to return to the joints. This allows healthier, less painful movement and activity.   An effective stretch should be held for at least 30 seconds.   A stretch should never be painful. You should only feel a gentle lengthening or release in the stretched tissue.  RANGE OF MOTION - Toe Extension, Flexion  Sit with your right / left leg crossed over your opposite knee.   Grasp your toes and gently pull them back toward the top of your foot. You should feel a stretch on the bottom of your toes and/or foot.   Hold this stretch for __5________ seconds.   Now, gently pull your toes toward the bottom of your foot. You should feel a stretch on the top of your toes and or foot.   Hold this stretch for _____5_____ seconds.  Repeat ___10_______ times. Complete this stretch ____3______ times per day.   RANGE OF MOTION - Ankle Dorsiflexion, Active Assisted  Remove shoes and sit on a chair that is preferably not on a carpeted surface.   Place right / left foot under knee. Extend your opposite leg for support.   Keeping your heel down, slide your right / left foot back toward the chair until you feel a stretch at your ankle or calf. If you do not feel a stretch, slide your bottom forward to the edge of the chair, while still keeping your heel down.   Hold this stretch for ____5______ seconds.  Repeat ___10_______ times. Complete this stretch ____3______ times per day.   STRETCH  Gastroc, Standing  Place hands on wall.   Extend right / left leg, keeping the front knee somewhat bent.   Slightly point your toes inward on your back foot.   Keeping your right / left heel on the floor and your knee  straight, shift your weight toward the wall, not allowing your back to arch.   You should feel a gentle stretch in the right / left calf. Hold this position for ____5______ seconds.  Repeat ____10______ times. Complete this stretch ____3______ times per day. STRETCH  Soleus, Standing  Place hands on wall.   Extend right / left leg, keeping the other knee somewhat bent.   Slightly point your toes inward on your back foot.   Keep your right / left heel on the floor, bend your back knee, and slightly shift your weight over the back leg so that you feel a gentle stretch deep in your back calf.   Hold this position for __________ seconds.  Repeat _____10_____ times. Complete this stretch ____3______ times per day. STRETCH  Gastrocsoleus, Standing  Note: This exercise can place a lot of stress on your foot and ankle. Please complete this exercise only if specifically instructed by your caregiver.    Place the ball of your right / left foot on a step, keeping your other foot firmly on the same step.   Hold on to the wall or a  rail for balance.   Slowly lift your other foot, allowing your body weight to press your heel down over the edge of the step.   You should feel a stretch in your right / left calf.   Hold this position for ____5______ seconds.   Repeat this exercise with a slight bend in your right / left knee.  Repeat ____10______ times. Complete this stretch ____3______ times per day.   STRENGTHENING EXERCISES - Plantar Fasciitis (Heel Spur Syndrome)  These exercises may help you when beginning to rehabilitate your injury. They may resolve your symptoms with or without further involvement from your physician, physical therapist or athletic trainer. While completing these exercises, remember:    Muscles can gain both the endurance and the strength needed for everyday activities through controlled exercises.   Complete these exercises as instructed by your physician, physical  therapist or athletic trainer. Progress the resistance and repetitions only as guided.  STRENGTH - Towel Curls  Sit in a chair positioned on a non-carpeted surface.   Place your foot on a towel, keeping your heel on the floor.   Pull the towel toward your heel by only curling your toes. Keep your heel on the floor.    Repeat _____10_____ times. Complete this exercise ____3______ times per day. STRENGTH - Ankle Inversion  Secure one end of a rubber exercise band/tubing to a fixed object (table, pole). Loop the other end around your foot just before your toes.   Place your fists between your knees. This will focus your strengthening at your ankle.   Slowly, pull your big toe up and in, making sure the band/tubing is positioned to resist the entire motion.   Hold this position for ____5______ seconds.   Have your muscles resist the band/tubing as it slowly pulls your foot back to the starting position.  Repeat ___10_______ times. Complete this exercises ____3______ times per day.   Document Released: 05/07/2005 Document Revised: 07/30/2011 Document Reviewed: 08/19/2008 [employer] Patient Information 2013 [place], [place].     WRIST SPLINT FOR CARPAL TUNNEL

## 2012-05-07 NOTE — Telephone Encounter (Signed)
Called and left message for patient to return call.  

## 2012-07-15 ENCOUNTER — Ambulatory Visit: Payer: 59 | Admitting: Family Medicine

## 2012-07-15 ENCOUNTER — Encounter: Payer: Self-pay | Admitting: Family Medicine

## 2013-07-28 ENCOUNTER — Emergency Department (INDEPENDENT_AMBULATORY_CARE_PROVIDER_SITE_OTHER): Payer: 59

## 2013-07-28 ENCOUNTER — Encounter (HOSPITAL_COMMUNITY): Payer: Self-pay | Admitting: Emergency Medicine

## 2013-07-28 ENCOUNTER — Emergency Department (INDEPENDENT_AMBULATORY_CARE_PROVIDER_SITE_OTHER)
Admission: EM | Admit: 2013-07-28 | Discharge: 2013-07-28 | Disposition: A | Discharge status: Home / Self Care | Payer: 59 | Source: Home / Self Care | Attending: Emergency Medicine | Admitting: Emergency Medicine

## 2013-07-28 DIAGNOSIS — E669 Obesity, unspecified: Secondary | ICD-10-CM

## 2013-07-28 DIAGNOSIS — X58XXXA Exposure to other specified factors, initial encounter: Secondary | ICD-10-CM

## 2013-07-28 DIAGNOSIS — F489 Nonpsychotic mental disorder, unspecified: Secondary | ICD-10-CM

## 2013-07-28 DIAGNOSIS — S93409A Sprain of unspecified ligament of unspecified ankle, initial encounter: Secondary | ICD-10-CM

## 2013-07-28 DIAGNOSIS — E785 Hyperlipidemia, unspecified: Secondary | ICD-10-CM

## 2013-07-28 DIAGNOSIS — S93402A Sprain of unspecified ligament of left ankle, initial encounter: Secondary | ICD-10-CM

## 2013-07-28 DIAGNOSIS — E119 Type 2 diabetes mellitus without complications: Secondary | ICD-10-CM

## 2013-07-28 DIAGNOSIS — I10 Essential (primary) hypertension: Secondary | ICD-10-CM

## 2013-07-28 NOTE — Discharge Instructions (Signed)
Ankle Sprain °An ankle sprain is an injury to the strong, fibrous tissues (ligaments) that hold the bones of your ankle joint together.  °CAUSES °An ankle sprain is usually caused by a fall or by twisting your ankle. Ankle sprains most commonly occur when you step on the outer edge of your foot, and your ankle turns inward. People who participate in sports are more prone to these types of injuries.  °SYMPTOMS  °· Pain in your ankle. The pain may be present at rest or only when you are trying to stand or walk. °· Swelling. °· Bruising. Bruising may develop immediately or within 1 to 2 days after your injury. °· Difficulty standing or walking, particularly when turning corners or changing directions. °DIAGNOSIS  °Your caregiver will ask you details about your injury and perform a physical exam of your ankle to determine if you have an ankle sprain. During the physical exam, your caregiver will press on and apply pressure to specific areas of your foot and ankle. Your caregiver will try to move your ankle in certain ways. An X-ray exam may be done to be sure a bone was not broken or a ligament did not separate from one of the bones in your ankle (avulsion fracture).  °TREATMENT  °Certain types of braces can help stabilize your ankle. Your caregiver can make a recommendation for this. Your caregiver may recommend the use of medicine for pain. If your sprain is severe, your caregiver may refer you to a surgeon who helps to restore function to parts of your skeletal system (orthopedist) or a physical therapist. °HOME CARE INSTRUCTIONS  °· Apply ice to your injury for 1 2 days or as directed by your caregiver. Applying ice helps to reduce inflammation and pain. °· Put ice in a plastic bag. °· Place a towel between your skin and the bag. °· Leave the ice on for 15-20 minutes at a time, every 2 hours while you are awake. °· Only take over-the-counter or prescription medicines for pain, discomfort, or fever as directed by  your caregiver. °· Elevate your injured ankle above the level of your heart as much as possible for 2 3 days. °· If your caregiver recommends crutches, use them as instructed. Gradually put weight on the affected ankle. Continue to use crutches or a cane until you can walk without feeling pain in your ankle. °· If you have a plaster splint, wear the splint as directed by your caregiver. Do not rest it on anything harder than a pillow for the first 24 hours. Do not put weight on it. Do not get it wet. You may take it off to take a shower or bath. °· You may have been given an elastic bandage to wear around your ankle to provide support. If the elastic bandage is too tight (you have numbness or tingling in your foot or your foot becomes cold and blue), adjust the bandage to make it comfortable. °· If you have an air splint, you may blow more air into it or let air out to make it more comfortable. You may take your splint off at night and before taking a shower or bath. Wiggle your toes in the splint several times per day to decrease swelling. °SEEK MEDICAL CARE IF:  °· You have rapidly increasing bruising or swelling. °· Your toes feel extremely cold or you lose feeling in your foot. °· Your pain is not relieved with medicine. °SEEK IMMEDIATE MEDICAL CARE IF: °· Your toes are numb   or blue.  You have severe pain that is increasing. MAKE SURE YOU:   Understand these instructions.  Will watch your condition.  Will get help right away if you are not doing well or get worse. Document Released: 05/07/2005 Document Revised: 01/30/2012 Document Reviewed: 05/19/2011 [facility] Patient Information 2014 [employer].  Your xrays were without evidence of any broken bones. Ice and elevate to reduce swelling. Splint as needed for comfort. Ibuprofen as directed on packaging for pain. Orthopedic follow up if no improvement over the next 2 weeks. Activity as tolerated

## 2013-07-28 NOTE — ED Notes (Addendum)
C/o left ankle pain States did ice and elevate the foot as tx States he was wrestling  States does need refill on medication  HCTZ Simvastatin Metformin omeprazole

## 2013-07-28 NOTE — ED Provider Notes (Signed)
CSN: [id number]     Arrival date & time 07/28/13  1434 History   None    Chief Complaint  Patient presents with  . Ankle Pain   (Consider location/radiation/quality/duration/timing/severity/associated sxs/prior Treatment) HPI Comments: Patient states he has had persistent left ankle discomfort and swelling with ambulation since stepping awkwardly off a ladder on 07/24/2013. Thinks he twisted his left ankle. Denies previous injury or surgery. No additional injuries.   The history is provided by the patient.    Past Medical History  Diagnosis Date  . Hyperlipidemia   . Hypertension   . Schizophrenia   . Diabetes mellitus without complication   . Acid reflux    History reviewed. No pertinent past surgical history. Family History  Problem Relation Age of Onset  . Mental illness      FAMILY HISTORY   . Mental illness Mother   . Diabetes     History  Substance Use Topics  . Smoking status: Never Smoker   . Smokeless tobacco: Not on file  . Alcohol Use: Not on file     Comment: WEEKENDS     Review of Systems  All other systems reviewed and are negative.    Allergies  Review of patient's allergies indicates no known allergies.  Home Medications   Current Outpatient Rx  Name  Route  Sig  Dispense  Refill  . acyclovir (ZOVIRAX) 400 MG tablet               . hydrochlorothiazide (HYDRODIURIL) 25 MG tablet   Oral   Take 1 tablet (25 mg total) by mouth daily. TAKE ONE TABLET DAILY FOR HIGH BLOOD PRESSURE   30 tablet   3   . IRON PO   Oral   Take 45 mg by mouth.         . metFORMIN (GLUCOPHAGE-XR) 500 MG 24 hr tablet   Oral   Take 1 tablet (500 mg total) by mouth daily with breakfast.   30 tablet   3   . EXPIRED: simvastatin (ZOCOR) 20 MG tablet   Oral   Take 1 tablet (20 mg total) by mouth every evening. For cholesterol   30 tablet   3   . traMADol (ULTRAM) 50 MG tablet   Oral   Take 1 tablet (50 mg total) by mouth every 6 (six) hours as needed for  pain.   60 tablet   0    BP 154/86  Pulse 78  Temp(Src) 98.8 F (37.1 C) (Oral)  Resp 16  SpO2 100% Physical Exam  Nursing note and vitals reviewed. Constitutional: He is oriented to person, place, and time. He appears well-developed and well-nourished. No distress.  HENT:  Head: Normocephalic and atraumatic.  Eyes: Conjunctivae are normal. No scleral icterus.  Cardiovascular: Normal rate.   Pulmonary/Chest: Effort normal.  Musculoskeletal: Normal range of motion.       Left ankle: He exhibits swelling. He exhibits normal range of motion, no ecchymosis, no deformity, no laceration and normal pulse. Tenderness. Lateral malleolus tenderness found. No medial malleolus tenderness found. Achilles tendon normal.       Feet:  Neurological: He is alert and oriented to person, place, and time.  Skin: Skin is warm and dry.  Psychiatric: He has a normal mood and affect. His behavior is normal.    ED Course  Procedures (including critical care time) Labs Review Labs Reviewed - No data to display Imaging Review Dg Ankle Complete Left  07/28/2013   CLINICAL DATA  Ankle pain.  EXAM LEFT ANKLE COMPLETE - 3+ VIEW  COMPARISON None.  FINDINGS Lateral ankle soft tissue swelling. There is no evidence of fracture, dislocation, or joint effusion. There is no evidence of arthropathy or other focal bone abnormality.  IMPRESSION No acute osseous findings.  SIGNATURE  Electronically Signed   By: Tiburcio Pea M.D.   On: 07/28/2013 17:02   Dg Foot Complete Left  07/28/2013   CLINICAL DATA Right foot/ankle injury 2 days ago, continued pain and soft tissue swelling laterally  EXAM LEFT FOOT - COMPLETE 3+ VIEW  COMPARISON None  FINDINGS Osseous mineralization normal.  Small to moderate-sized plantar calcaneal spur.  No acute fracture, dislocation or bone destruction.  Soft tissue swelling at ankle.  IMPRESSION No acute osseous abnormalities.  SIGNATURE  Electronically Signed   By: Ulyses Southward M.D.   On:  07/28/2013 17:01     MDM   1. Left ankle sprain    Left ankle sprain: ASO, NSAIDs, RICE therapy with ortho follow up if no improvement over next 1-2 weeks. Xrays unremarkable for acute injury.    Jess Barters [place], [place] 07/28/13 1716  Jess Barters [place], [place] 07/29/13 [phone]

## 2013-07-30 NOTE — ED Provider Notes (Signed)
Medical screening examination/treatment/procedure(s) were performed by resident physician or non-physician practitioner and as supervising physician I was immediately available for consultation/collaboration.   Barkley BrunsKINDL,Concetta Guion DOUGLAS MD.   Linna HoffJames D Heyden Jaber, MD 07/30/13 [phone]

## 2013-08-17 ENCOUNTER — Ambulatory Visit (INDEPENDENT_AMBULATORY_CARE_PROVIDER_SITE_OTHER): Payer: 59 | Admitting: Family Medicine

## 2013-08-17 ENCOUNTER — Encounter: Payer: Self-pay | Admitting: Family Medicine

## 2013-08-17 VITALS — BP 154/88 | HR 88 | Temp 98.4°F | Resp 16 | Ht 67.5 in | Wt 235.0 lb

## 2013-08-17 DIAGNOSIS — E669 Obesity, unspecified: Secondary | ICD-10-CM

## 2013-08-17 DIAGNOSIS — I10 Essential (primary) hypertension: Secondary | ICD-10-CM

## 2013-08-17 DIAGNOSIS — S93402A Sprain of unspecified ligament of left ankle, initial encounter: Secondary | ICD-10-CM | POA: Insufficient documentation

## 2013-08-17 DIAGNOSIS — S93409A Sprain of unspecified ligament of unspecified ankle, initial encounter: Secondary | ICD-10-CM

## 2013-08-17 DIAGNOSIS — F489 Nonpsychotic mental disorder, unspecified: Secondary | ICD-10-CM

## 2013-08-17 DIAGNOSIS — M722 Plantar fascial fibromatosis: Secondary | ICD-10-CM

## 2013-08-17 DIAGNOSIS — E119 Type 2 diabetes mellitus without complications: Secondary | ICD-10-CM

## 2013-08-17 DIAGNOSIS — F99 Mental disorder, not otherwise specified: Secondary | ICD-10-CM

## 2013-08-17 DIAGNOSIS — E785 Hyperlipidemia, unspecified: Secondary | ICD-10-CM

## 2013-08-17 DIAGNOSIS — E1169 Type 2 diabetes mellitus with other specified complication: Secondary | ICD-10-CM

## 2013-08-17 LAB — MICROALBUMIN / CREATININE URINE RATIO
Creatinine, Urine: 176.8 mg/dL
MICROALB UR: 6.29 mg/dL — AB (ref 0.00–1.89)
Microalb Creat Ratio: 35.6 mg/g — ABNORMAL HIGH (ref 0.0–30.0)

## 2013-08-17 LAB — HEMOGLOBIN A1C, FINGERSTICK: HEMOGLOBIN A1C, FINGERSTICK: 5.8 % — AB (ref <5.7)

## 2013-08-17 MED ORDER — TRAMADOL HCL 50 MG PO TABS: 50.0000 mg | ORAL_TABLET | Freq: Four times a day (QID) | ORAL | Status: DC | PRN

## 2013-08-17 MED ORDER — LISINOPRIL-HYDROCHLOROTHIAZIDE 10-12.5 MG PO TABS: 1.0000 | ORAL_TABLET | Freq: Every day | ORAL | Status: DC

## 2013-08-17 MED ORDER — NAPROXEN 500 MG PO TABS: 500.0000 mg | ORAL_TABLET | Freq: Two times a day (BID) | ORAL | Status: DC

## 2013-08-17 NOTE — Assessment & Plan Note (Addendum)
A1c today in office is 5.8% we will hold off on any medications at this time. We discussed dietary changes. He was given a prescription for new meter he can check his blood sugar 2-3 times a week fasting He declines Pneumovax today given information about it

## 2013-08-17 NOTE — Patient Instructions (Signed)
F/U in 1 week for blood pressure and lab results  Ice your ankle, use stabilizing brace Take the naprosyn twice a day for inflammation with food Start blood pressure medications

## 2013-08-17 NOTE — Assessment & Plan Note (Signed)
He has history of depression as well as schizophrenia overall he looks well today there were no red flags on exam

## 2013-08-17 NOTE — Assessment & Plan Note (Signed)
He has gained 10 pounds since her last visit about a year and a half ago discussed dietary habits

## 2013-08-17 NOTE — Assessment & Plan Note (Signed)
Blood pressure is quite elevated I will start him on lisinopril HCTZ once a day

## 2013-08-17 NOTE — Assessment & Plan Note (Signed)
Recheck his cholesterol level and decide on dose of simvastatin

## 2013-08-17 NOTE — Assessment & Plan Note (Signed)
Rice therapy ,continue brace, NSAIDS

## 2013-08-17 NOTE — Progress Notes (Signed)
Patient ID: Alex Holt, male   DOB: 07/28/1965, 48 y.o.   MRN: [id number]   Subjective:    Patient ID: Alex Holt, male    DOB: 07/17/1965, 48 y.o.   MRN: [id number]  Patient presents for CPE  patient here to reestablish care. He was last seen in 2013 has a history of diabetes mellitus hypertension hyperlipidemia schizophrenia and depression which he is on medications 4. He was last on cholesterol meds as well as metformin and blood pressure medications. He states that overall he feels well. He was seen in urgent care couple weeks ago after spraining his left ankle he was not given any medications but he is using a lace up brace. He continues to have some pain in his ankle. He has not been checked his blood sugar recently although he does have a couple of meters at home.    Review Of Systems:  GEN- denies fatigue, fever, weight loss,weakness, recent illness HEENT- denies eye drainage, change in vision, nasal discharge, CVS- denies chest pain, palpitations RESP- denies SOB, cough, wheeze ABD- denies N/V, change in stools, abd pain GU- denies dysuria, hematuria, dribbling, incontinence MSK- + joint pain, muscle aches, injury Neuro- denies headache, dizziness, syncope, seizure activity       Objective:    BP 154/88  Pulse 88  Temp(Src) 98.4 F (36.9 C) (Oral)  Resp 16  Ht 5' 7.5" (1.715 m)  Wt 235 lb (106.595 kg)  BMI 36.24 kg/m2 GEN- NAD, alert and oriented x3 HEENT- PERRL, EOMI, non injected sclera, pink conjunctiva, MMM, oropharynx clear Neck- Supple,  CVS- RRR, no murmur RESP-CTAB ABD-NABS,soft,NT,ND EXT-trace left pedal edema MSK- Good ROM bilat ankles, pain with flexion left abkle, TTP over left lateral malleous Psych- normal affect and mood  Pulses- Radial, DP- 2+        Assessment & Plan:      Problem List Items Addressed This Visit   Plantar fasciitis   Relevant Medications      traMADol (ULTRAM) 50 MG tablet   OBESITY     He has gained 10 pounds  since her last visit about a year and a half ago discussed dietary habits    HYPERTENSION     Blood pressure is quite elevated I will start him on lisinopril HCTZ once a day    Relevant Medications      LISINOPRIL-HCTZ 10-12.5 MG PO TABS   HYPERLIPIDEMIA     Recheck his cholesterol level and decide on dose of simvastatin    Relevant Orders      Lipid panel   Diabetes mellitus type 2 in obese - Primary     A1c today in office is 5.8% we will hold off on any medications at this time. We discussed dietary changes. He was given a prescription for new meter he can check his blood sugar 2-3 times a week fasting    Relevant Orders      CBC with Differential      Comprehensive metabolic panel      Lipid panel      Hemoglobin A1C, fingerstick (Completed)      HM DIABETES FOOT EXAM (Completed)      Microalbumin / creatinine urine ratio (Completed)   Chronic mental illness     He has history of depression as well as schizophrenia overall he looks well today there were no red flags on exam       Note: This dictation was prepared with Dragon dictation along with smaller phrase  technology. Any transcriptional errors that result from this process are unintentional.

## 2013-08-18 LAB — CBC WITH DIFFERENTIAL/PLATELET
BASOS ABS: 0.1 10*3/uL (ref 0.0–0.1)
Basophils Relative: 1 % (ref 0–1)
EOS PCT: 1 % (ref 0–5)
Eosinophils Absolute: 0.1 10*3/uL (ref 0.0–0.7)
HCT: 39.1 % (ref 39.0–52.0)
Hemoglobin: 12.5 g/dL — ABNORMAL LOW (ref 13.0–17.0)
LYMPHS PCT: 34 % (ref 12–46)
Lymphs Abs: 1.8 10*3/uL (ref 0.7–4.0)
MCH: 22 pg — ABNORMAL LOW (ref 26.0–34.0)
MCHC: 32 g/dL (ref 30.0–36.0)
MCV: 69 fL — AB (ref 78.0–100.0)
Monocytes Absolute: 0.3 10*3/uL (ref 0.1–1.0)
Monocytes Relative: 6 % (ref 3–12)
Neutro Abs: 3.1 10*3/uL (ref 1.7–7.7)
Neutrophils Relative %: 58 % (ref 43–77)
PLATELETS: 400 10*3/uL (ref 150–400)
RBC: 5.67 MIL/uL (ref 4.22–5.81)
RDW: 16.6 % — AB (ref 11.5–15.5)
WBC: 5.3 10*3/uL (ref 4.0–10.5)

## 2013-08-18 LAB — COMPREHENSIVE METABOLIC PANEL
ALT: 16 U/L (ref 0–53)
AST: 14 U/L (ref 0–37)
Albumin: 4.2 g/dL (ref 3.5–5.2)
Alkaline Phosphatase: 61 U/L (ref 39–117)
BILIRUBIN TOTAL: 0.3 mg/dL (ref 0.2–1.2)
BUN: 15 mg/dL (ref 6–23)
CHLORIDE: 101 meq/L (ref 96–112)
CO2: 29 mEq/L (ref 19–32)
CREATININE: 0.93 mg/dL (ref 0.50–1.35)
Calcium: 9.9 mg/dL (ref 8.4–10.5)
Glucose, Bld: 97 mg/dL (ref 70–99)
Potassium: 4.2 mEq/L (ref 3.5–5.3)
Sodium: 137 mEq/L (ref 135–145)
Total Protein: 7.4 g/dL (ref 6.0–8.3)

## 2013-08-18 LAB — LIPID PANEL
CHOL/HDL RATIO: 3.6 ratio
Cholesterol: 225 mg/dL — ABNORMAL HIGH (ref 0–200)
HDL: 63 mg/dL (ref >39)
LDL Cholesterol: 147 mg/dL — ABNORMAL HIGH (ref 0–99)
Triglycerides: 73 mg/dL (ref <150)
VLDL: 15 mg/dL (ref 0–40)

## 2013-08-19 ENCOUNTER — Ambulatory Visit: Payer: 59 | Admitting: Family Medicine

## 2013-08-24 ENCOUNTER — Ambulatory Visit: Payer: 59 | Admitting: Family Medicine

## 2013-09-07 ENCOUNTER — Encounter: Payer: Self-pay | Admitting: Family Medicine

## 2013-09-07 ENCOUNTER — Ambulatory Visit (INDEPENDENT_AMBULATORY_CARE_PROVIDER_SITE_OTHER): Payer: 59 | Admitting: Family Medicine

## 2013-09-07 VITALS — BP 146/88 | HR 82 | Temp 98.4°F | Resp 14 | Ht 67.0 in | Wt 232.0 lb

## 2013-09-07 DIAGNOSIS — I10 Essential (primary) hypertension: Secondary | ICD-10-CM

## 2013-09-07 DIAGNOSIS — E1169 Type 2 diabetes mellitus with other specified complication: Secondary | ICD-10-CM

## 2013-09-07 DIAGNOSIS — E669 Obesity, unspecified: Secondary | ICD-10-CM

## 2013-09-07 DIAGNOSIS — E785 Hyperlipidemia, unspecified: Secondary | ICD-10-CM

## 2013-09-07 DIAGNOSIS — D649 Anemia, unspecified: Secondary | ICD-10-CM

## 2013-09-07 DIAGNOSIS — Z23 Encounter for immunization: Secondary | ICD-10-CM

## 2013-09-07 DIAGNOSIS — E119 Type 2 diabetes mellitus without complications: Secondary | ICD-10-CM

## 2013-09-07 MED ORDER — ATORVASTATIN CALCIUM 20 MG PO TABS: 20.0000 mg | ORAL_TABLET | Freq: Every day | ORAL | Status: AC

## 2013-09-07 NOTE — Assessment & Plan Note (Signed)
Diet controlled he is on ACE inhibitor

## 2013-09-07 NOTE — Progress Notes (Signed)
Patient ID: Alex Holt, male   DOB: 09/28/1965, 48 y.o.   MRN: [id number]    Subjective:    Patient ID: Alex Holt, male    DOB: 12/25/1965, 48 y.o.   MRN: [id number]  Patient presents for F/U- lab results  patient here to follow up labs. He was seen about 3 weeks ago to reestablish care for his diabetes mellitus hypertension. He was also started on lisinopril HCTZ which she's been taking one tablet daily without any difficulties. His fasting labs are reviewed his total cholesterol is elevated as well as his LDL goal is less than 100. His A1c did look at the last visit of 5.8%. His creatinine was normal however he did have some protein in the urine. He is due for Pneumovax and tetanus booster  No concerns   Review Of Systems:  GEN- denies fatigue, fever, weight loss,weakness, recent illness       Objective:    BP 146/88  Pulse 82  Temp(Src) 98.4 F (36.9 C) (Oral)  Resp 14  Ht 5\' 7"  (1.702 m)  Wt 232 lb (105.235 kg)  BMI 36.33 kg/m2 GEN- NAD, alert and oriented x3        Assessment & Plan:      Problem List Items Addressed This Visit   None    Visit Diagnoses   Need for prophylactic vaccination with combined diphtheria-tetanus-pertussis (DTP) vaccine    -  Primary    Relevant Orders       Tdap vaccine greater than or equal to 7yo IM (Completed)    Need for prophylactic vaccination against Streptococcus pneumoniae (pneumococcus)        Relevant Orders       Pneumococcal polysaccharide vaccine 23-valent greater than or equal to 2yo subcutaneous/IM (Completed)       Note: This dictation was prepared with Dragon dictation along with smaller phrase technology. Any transcriptional errors that result from this process are unintentional.

## 2013-09-07 NOTE — Assessment & Plan Note (Signed)
Start Lipitor 20 mg at bedtime discussed dietary changes

## 2013-09-07 NOTE — Assessment & Plan Note (Signed)
Blood pressure is improved today but still not at goal. I will have him continue lisinopril HCTZ we will recheck in 3 months and adjust as needed I will prefer to have him less than 130/80

## 2013-09-07 NOTE — Patient Instructions (Signed)
Continue current medications Start the cholesterol medication at night Tetanus booster given and pneumonia shot Multivitamin with iron take once a day  F/U 3 months

## 2013-09-07 NOTE — Assessment & Plan Note (Signed)
Chronic mild microcytic anemia. He's not been taking his iron tablets and multivitamin. He's not had any bleeding. I'll recheck at the next visit if there is any significant change will be referred for an Early colonoscopy

## 2013-10-20 IMAGING — CR DG FOOT COMPLETE 3+V*R*
3 series · 3 of 3 positions shown · non-contrast
Comparison: None.

CLINICAL DATA: Right hip pain for 1 week.  No injury.

RIGHT FOOT COMPLETE - 3+ VIEW

[view not recorded (1 of 3)]
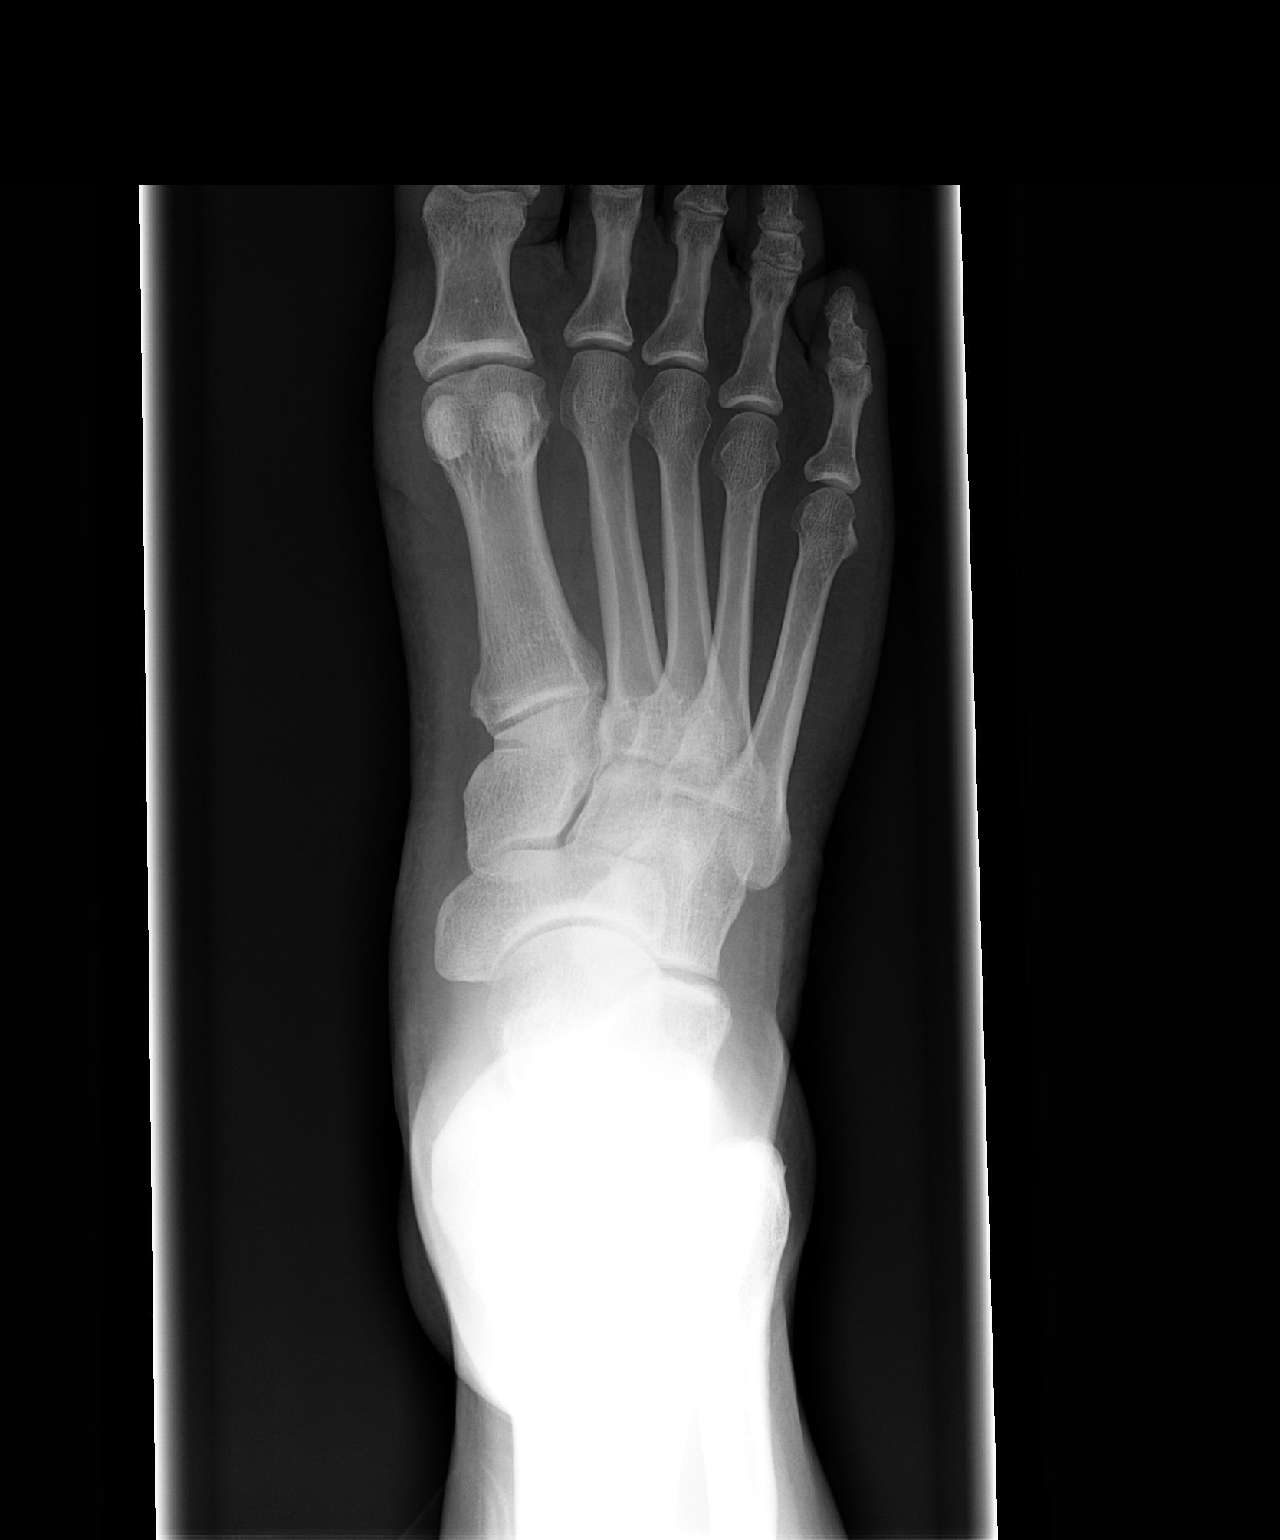

[view not recorded (2 of 3)]
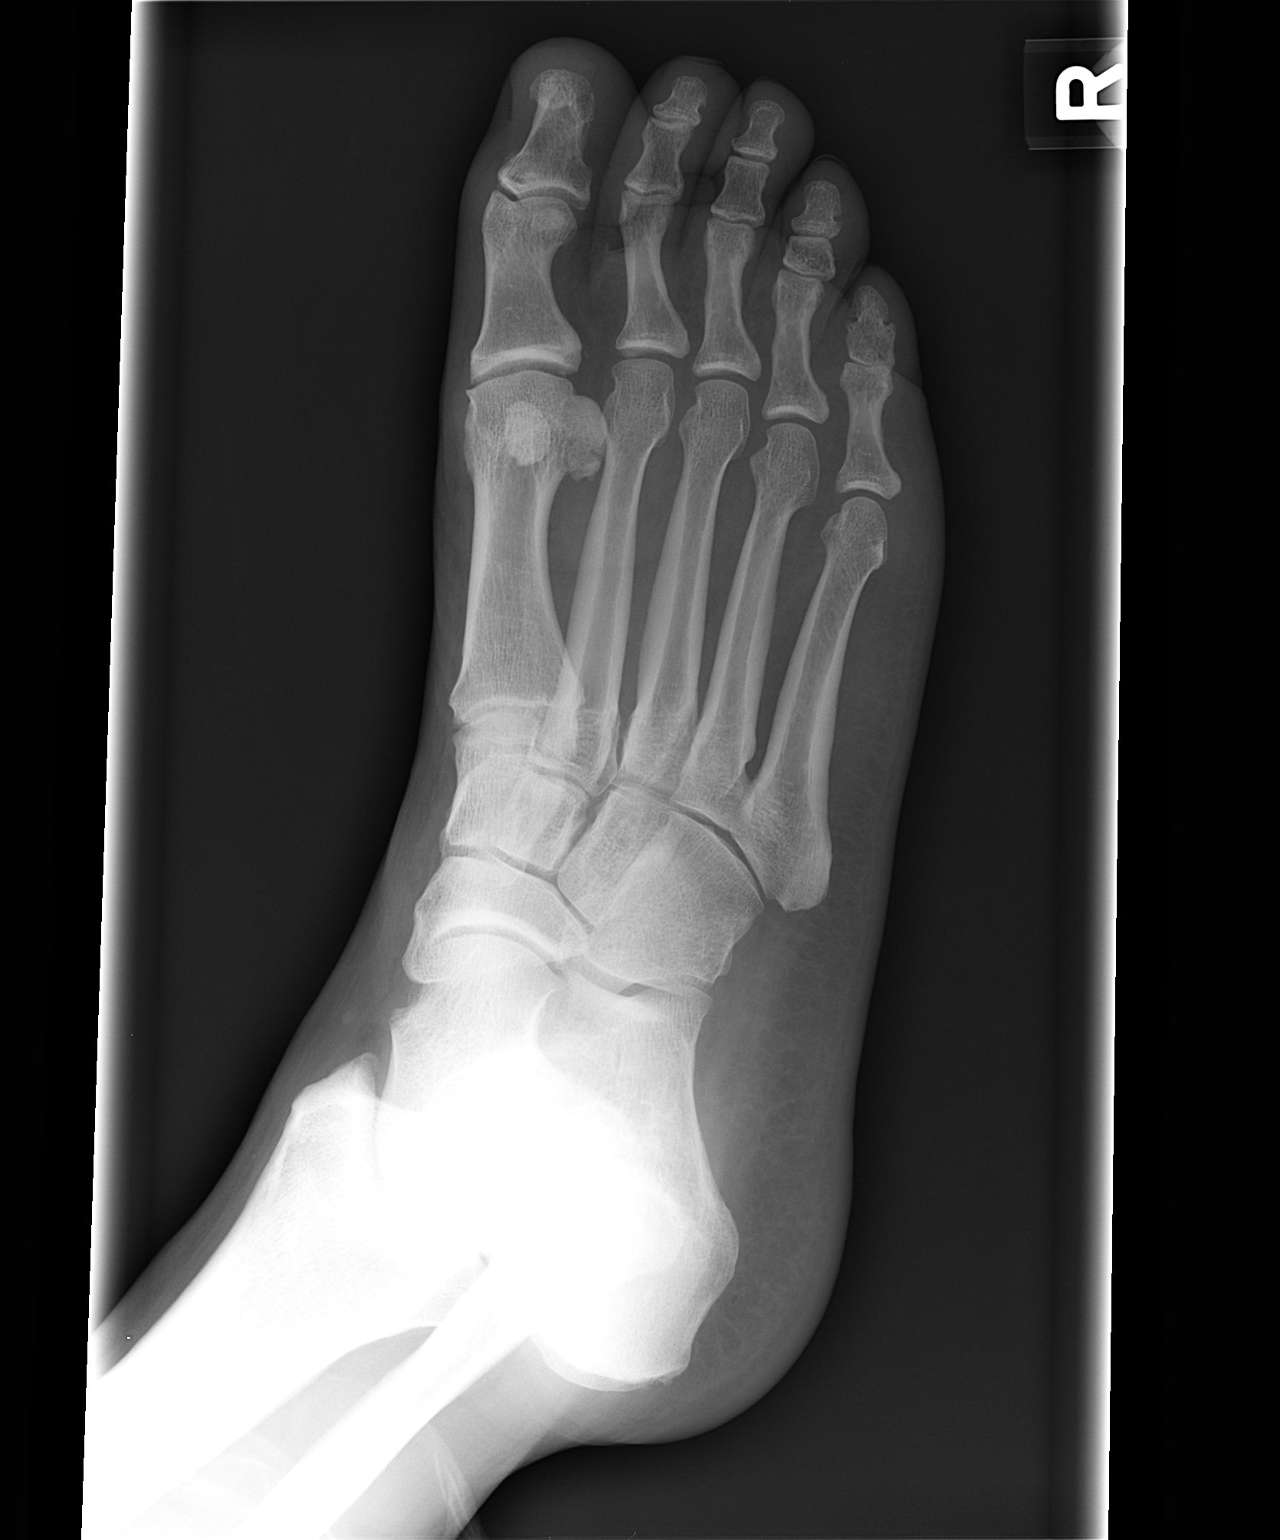

[view not recorded (3 of 3)]
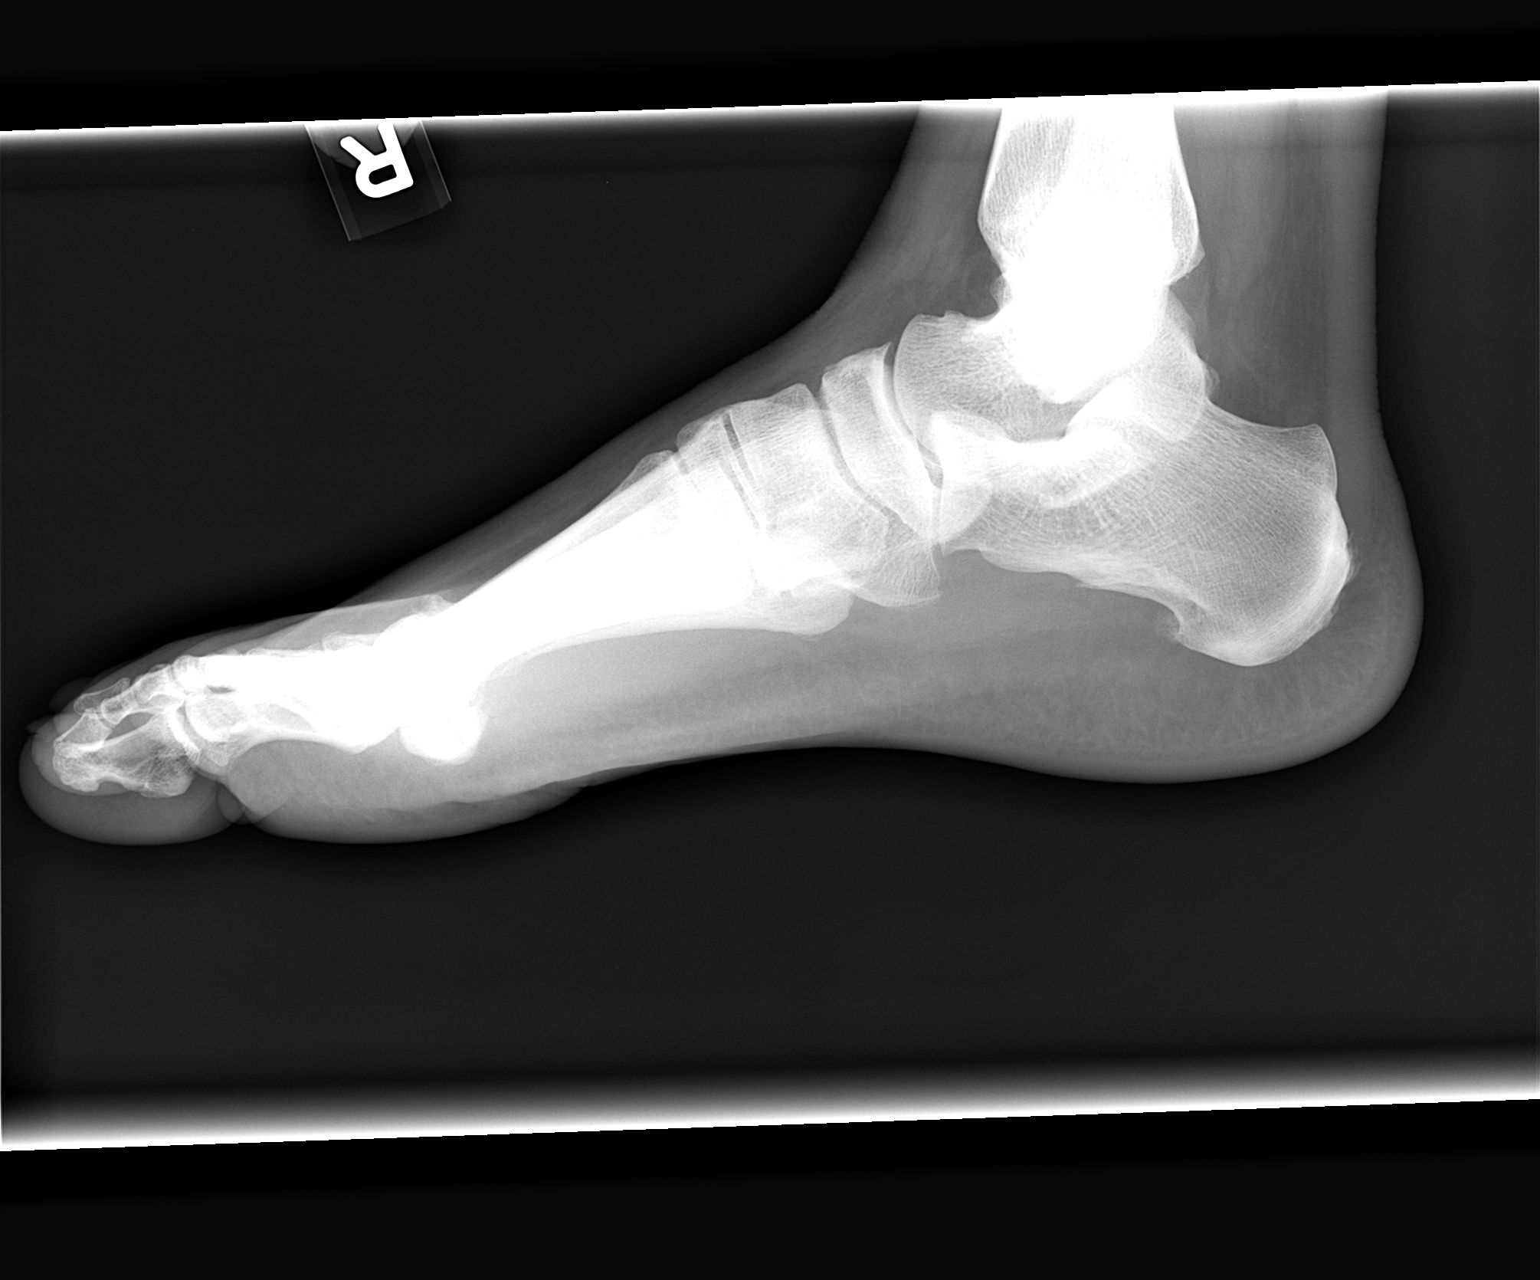

[3 of 3 positions shown; findings below may reference images not displayed]

FINDINGS: There is no evidence of fracture or dislocation.  There
is no evidence of arthropathy or other focal bony abnormality.
Soft tissues are unremarkable. Slight plantar spur. Early Achilles
spur.
IMPRESSION: Slight plantar spur. Early Achilles spur.  No acute fracture or
dislocation.

## 2013-11-28 ENCOUNTER — Other Ambulatory Visit: Payer: Self-pay | Admitting: Family Medicine

## 2013-11-28 DIAGNOSIS — Z79899 Other long term (current) drug therapy: Secondary | ICD-10-CM

## 2013-11-28 DIAGNOSIS — E669 Obesity, unspecified: Principal | ICD-10-CM

## 2013-11-28 DIAGNOSIS — E1169 Type 2 diabetes mellitus with other specified complication: Secondary | ICD-10-CM

## 2013-11-28 DIAGNOSIS — I10 Essential (primary) hypertension: Secondary | ICD-10-CM

## 2013-11-28 DIAGNOSIS — E785 Hyperlipidemia, unspecified: Secondary | ICD-10-CM

## 2013-11-28 DIAGNOSIS — D649 Anemia, unspecified: Secondary | ICD-10-CM

## 2013-12-07 ENCOUNTER — Ambulatory Visit: Payer: 59 | Admitting: Family Medicine

## 2013-12-14 ENCOUNTER — Other Ambulatory Visit: Payer: 59

## 2013-12-14 DIAGNOSIS — Z79899 Other long term (current) drug therapy: Secondary | ICD-10-CM

## 2013-12-14 DIAGNOSIS — D649 Anemia, unspecified: Secondary | ICD-10-CM

## 2013-12-14 DIAGNOSIS — E1169 Type 2 diabetes mellitus with other specified complication: Secondary | ICD-10-CM

## 2013-12-14 DIAGNOSIS — E785 Hyperlipidemia, unspecified: Secondary | ICD-10-CM

## 2013-12-14 DIAGNOSIS — E669 Obesity, unspecified: Principal | ICD-10-CM

## 2013-12-14 DIAGNOSIS — I10 Essential (primary) hypertension: Secondary | ICD-10-CM

## 2013-12-15 LAB — LIPID PANEL
CHOLESTEROL: 167 mg/dL (ref 0–200)
HDL: 63 mg/dL (ref >39)
LDL CALC: 78 mg/dL (ref 0–99)
TRIGLYCERIDES: 129 mg/dL (ref <150)
Total CHOL/HDL Ratio: 2.7 Ratio
VLDL: 26 mg/dL (ref 0–40)

## 2013-12-15 LAB — CBC WITH DIFFERENTIAL/PLATELET
BASOS ABS: 0 10*3/uL (ref 0.0–0.1)
Basophils Relative: 0 % (ref 0–1)
EOS ABS: 0.1 10*3/uL (ref 0.0–0.7)
EOS PCT: 1 % (ref 0–5)
HCT: 39.9 % (ref 39.0–52.0)
Hemoglobin: 12.9 g/dL — ABNORMAL LOW (ref 13.0–17.0)
Lymphocytes Relative: 33 % (ref 12–46)
Lymphs Abs: 2.2 10*3/uL (ref 0.7–4.0)
MCH: 22.5 pg — AB (ref 26.0–34.0)
MCHC: 32.3 g/dL (ref 30.0–36.0)
MCV: 69.5 fL — AB (ref 78.0–100.0)
Monocytes Absolute: 0.3 10*3/uL (ref 0.1–1.0)
Monocytes Relative: 5 % (ref 3–12)
Neutro Abs: 4.1 10*3/uL (ref 1.7–7.7)
Neutrophils Relative %: 61 % (ref 43–77)
PLATELETS: 403 10*3/uL — AB (ref 150–400)
RBC: 5.74 MIL/uL (ref 4.22–5.81)
RDW: 16.4 % — AB (ref 11.5–15.5)
WBC: 6.8 10*3/uL (ref 4.0–10.5)

## 2013-12-15 LAB — COMPREHENSIVE METABOLIC PANEL
ALT: 25 U/L (ref 0–53)
AST: 20 U/L (ref 0–37)
Albumin: 4.4 g/dL (ref 3.5–5.2)
Alkaline Phosphatase: 55 U/L (ref 39–117)
BILIRUBIN TOTAL: 0.4 mg/dL (ref 0.2–1.2)
BUN: 17 mg/dL (ref 6–23)
CO2: 28 meq/L (ref 19–32)
CREATININE: 1.18 mg/dL (ref 0.50–1.35)
Calcium: 10.1 mg/dL (ref 8.4–10.5)
Chloride: 102 mEq/L (ref 96–112)
Glucose, Bld: 91 mg/dL (ref 70–99)
Potassium: 4.1 mEq/L (ref 3.5–5.3)
Sodium: 141 mEq/L (ref 135–145)
Total Protein: 7.7 g/dL (ref 6.0–8.3)

## 2013-12-15 LAB — HEMOGLOBIN A1C
HEMOGLOBIN A1C: 6.2 % — AB (ref <5.7)
Mean Plasma Glucose: 131 mg/dL — ABNORMAL HIGH (ref <117)

## 2013-12-18 ENCOUNTER — Ambulatory Visit: Payer: 59 | Admitting: Family Medicine

## 2013-12-21 ENCOUNTER — Ambulatory Visit: Payer: 59 | Admitting: Family Medicine

## 2013-12-23 ENCOUNTER — Ambulatory Visit (INDEPENDENT_AMBULATORY_CARE_PROVIDER_SITE_OTHER): Payer: 59 | Admitting: Family Medicine

## 2013-12-23 ENCOUNTER — Encounter: Payer: Self-pay | Admitting: Family Medicine

## 2013-12-23 VITALS — BP 152/88 | HR 98 | Temp 98.3°F | Resp 16 | Ht 67.0 in | Wt 229.0 lb

## 2013-12-23 DIAGNOSIS — T148 Other injury of unspecified body region: Secondary | ICD-10-CM

## 2013-12-23 DIAGNOSIS — J309 Allergic rhinitis, unspecified: Secondary | ICD-10-CM | POA: Insufficient documentation

## 2013-12-23 DIAGNOSIS — W57XXXA Bitten or stung by nonvenomous insect and other nonvenomous arthropods, initial encounter: Secondary | ICD-10-CM

## 2013-12-23 DIAGNOSIS — E785 Hyperlipidemia, unspecified: Secondary | ICD-10-CM

## 2013-12-23 DIAGNOSIS — E669 Obesity, unspecified: Secondary | ICD-10-CM

## 2013-12-23 DIAGNOSIS — J301 Allergic rhinitis due to pollen: Secondary | ICD-10-CM

## 2013-12-23 DIAGNOSIS — E1169 Type 2 diabetes mellitus with other specified complication: Secondary | ICD-10-CM

## 2013-12-23 DIAGNOSIS — E119 Type 2 diabetes mellitus without complications: Secondary | ICD-10-CM

## 2013-12-23 DIAGNOSIS — I10 Essential (primary) hypertension: Secondary | ICD-10-CM

## 2013-12-23 MED ORDER — AMLODIPINE BESYLATE 5 MG PO TABS: 5.0000 mg | ORAL_TABLET | Freq: Every day | ORAL | Status: DC

## 2013-12-23 MED ORDER — TRIAMCINOLONE ACETONIDE 0.1 % EX CREA: 1.0000 "application " | TOPICAL_CREAM | Freq: Two times a day (BID) | CUTANEOUS | Status: DC

## 2013-12-23 MED ORDER — HYDROCHLOROTHIAZIDE 25 MG PO TABS
25.0000 mg | ORAL_TABLET | Freq: Every day | ORAL | Status: DC
Start: 2013-12-23 — End: 2023-04-29

## 2013-12-23 MED ORDER — FLUTICASONE PROPIONATE 50 MCG/ACT NA SUSP: 2.0000 | Freq: Every day | NASAL | Status: DC

## 2013-12-23 NOTE — Progress Notes (Signed)
Patient ID: Alex Holt, male   DOB: 04/28/1966, 48 y.o.   MRN: [id number]   Subjective:    Patient ID: Alex Holt, male    DOB: 08/17/1965, 48 y.o.   MRN: [id number]  Patient presents for 3 month F/U and Skin Irritation  patient here follow chronic medical problems. He has history of diabetes mellitus which is diet controlled his last A1c was 6.2%. His cholesterol is also significantly improved he is taking Lipitor. His blood pressure continues to be elevated. He's had a chronic mostly dry cough for the past couple months. He is on lisinopril HCTZ. At times he does get some nasal drainage but has been more recently.  Flea bites he was around a family member thought she was assessed with wheezing his multiple bites on his lower legs. He's been scratching unconditionally. He's not had any drainage from the lesions.  Obesity-his diet is very poor he typically eats one very large meal before he goes to bed. He also eats a lot of carbohydrates. He asked about diet pill    Review Of Systems:  GEN- denies fatigue, fever, weight loss,weakness, recent illness HEENT- denies eye drainage, change in vision,+ nasal discharge, CVS- denies chest pain, palpitations RESP- denies SOB, +cough, wheeze ABD- denies N/V, change in stools, abd pain GU- denies dysuria, hematuria, dribbling, incontinence MSK- denies joint pain, muscle aches, injury Neuro- denies headache, dizziness, syncope, seizure activity       Objective:    BP 152/88  Pulse 98  Temp(Src) 98.3 F (36.8 C) (Oral)  Resp 16  Ht 5\' 7"  (1.702 m)  Wt 229 lb (103.874 kg)  BMI 35.86 kg/m2 GEN- NAD, alert and oriented x3 HEENT- PERRL, EOMI, non injected sclera, pink conjunctiva, MMM, oropharynx clear, nares clear rhinorrhea Neck- Supple,  CVS- RRR, no murmur RESP-CTAB EXT-No edema Skin- multiple flea bite lesions with scabs, no erythema, no pustules,NT Pulses- Radial, DP- 2+     Assessment & Plan:      Problem List Items  Addressed This Visit   None      Note: This dictation was prepared with Dragon dictation along with smaller phrase technology. Any transcriptional errors that result from this process are unintentional.

## 2013-12-23 NOTE — Assessment & Plan Note (Signed)
I think postnasal drip may also be contributing to his cough. We will put him on Flonase as well

## 2013-12-23 NOTE — Assessment & Plan Note (Signed)
Topical antibiotic given from office TAC for itching

## 2013-12-23 NOTE — Assessment & Plan Note (Signed)
LDL is at goal no change to Lipitor

## 2013-12-23 NOTE — Assessment & Plan Note (Signed)
Blood pressure uncontrolled secondary to the cough with the lisinopril we'll discontinue this and put him on Norvasc 5 mg HCTZ 25 mg

## 2013-12-23 NOTE — Assessment & Plan Note (Signed)
Diet control no medication

## 2013-12-23 NOTE — Assessment & Plan Note (Signed)
I had a long discussion about his current eating habits the diet pills not to help him he typically eats one large meal and is very heavy and then he sits and watches TV and goes to sleep. He needs a break his meals up to at least 3 meals a day or even better 5 small meals and also improve his diet we discussed low carb with more fruits vegetables water and healthy fat

## 2013-12-23 NOTE — Patient Instructions (Signed)
Stop lisinopril HCTZ this is causing the cough Start HCTZ 1 tablet a day Norvasc 5mg  once a day  Cream for your legs  Flonase 2 sprays each nose twice a day  F/U 4 weeks

## 2014-01-13 ENCOUNTER — Other Ambulatory Visit: Payer: Self-pay | Admitting: Family Medicine

## 2014-01-14 NOTE — Telephone Encounter (Signed)
Refill appropriate and filled per protocol. 

## 2014-01-20 ENCOUNTER — Ambulatory Visit: Payer: 59 | Admitting: Family Medicine

## 2014-02-03 ENCOUNTER — Ambulatory Visit: Payer: 59 | Admitting: *Deleted

## 2014-02-03 VITALS — BP 130/80

## 2014-02-03 DIAGNOSIS — I10 Essential (primary) hypertension: Secondary | ICD-10-CM

## 2014-05-18 ENCOUNTER — Other Ambulatory Visit: Payer: Self-pay | Admitting: *Deleted

## 2014-05-18 MED ORDER — TRIAMCINOLONE ACETONIDE 0.1 % EX CREA: 1.0000 "application " | TOPICAL_CREAM | Freq: Two times a day (BID) | CUTANEOUS | Status: DC

## 2014-05-18 NOTE — Telephone Encounter (Signed)
Received fax requesting refill on triamcinolone cream.   Refill appropriate and filled per protocol.

## 2014-06-14 ENCOUNTER — Encounter: Payer: Self-pay | Admitting: Family Medicine

## 2014-07-19 ENCOUNTER — Encounter: Payer: Self-pay | Admitting: Family Medicine

## 2014-08-17 ENCOUNTER — Other Ambulatory Visit: Payer: Self-pay | Admitting: *Deleted

## 2014-08-17 ENCOUNTER — Encounter: Payer: Self-pay | Admitting: *Deleted

## 2014-08-17 MED ORDER — AMLODIPINE BESYLATE 5 MG PO TABS: 5.0000 mg | ORAL_TABLET | Freq: Every day | ORAL | Status: DC

## 2014-08-17 NOTE — Telephone Encounter (Signed)
Medication filled x1 with no refills.   Requires office visit before any further refills can be given.   Letter sent.  

## 2014-08-19 ENCOUNTER — Encounter: Payer: Self-pay | Admitting: Family Medicine

## 2015-02-09 IMAGING — CR DG ANKLE COMPLETE 3+V*L*
3 series · 3 of 3 positions shown · non-contrast
Comparison: none

[view not recorded (1 of 3)]
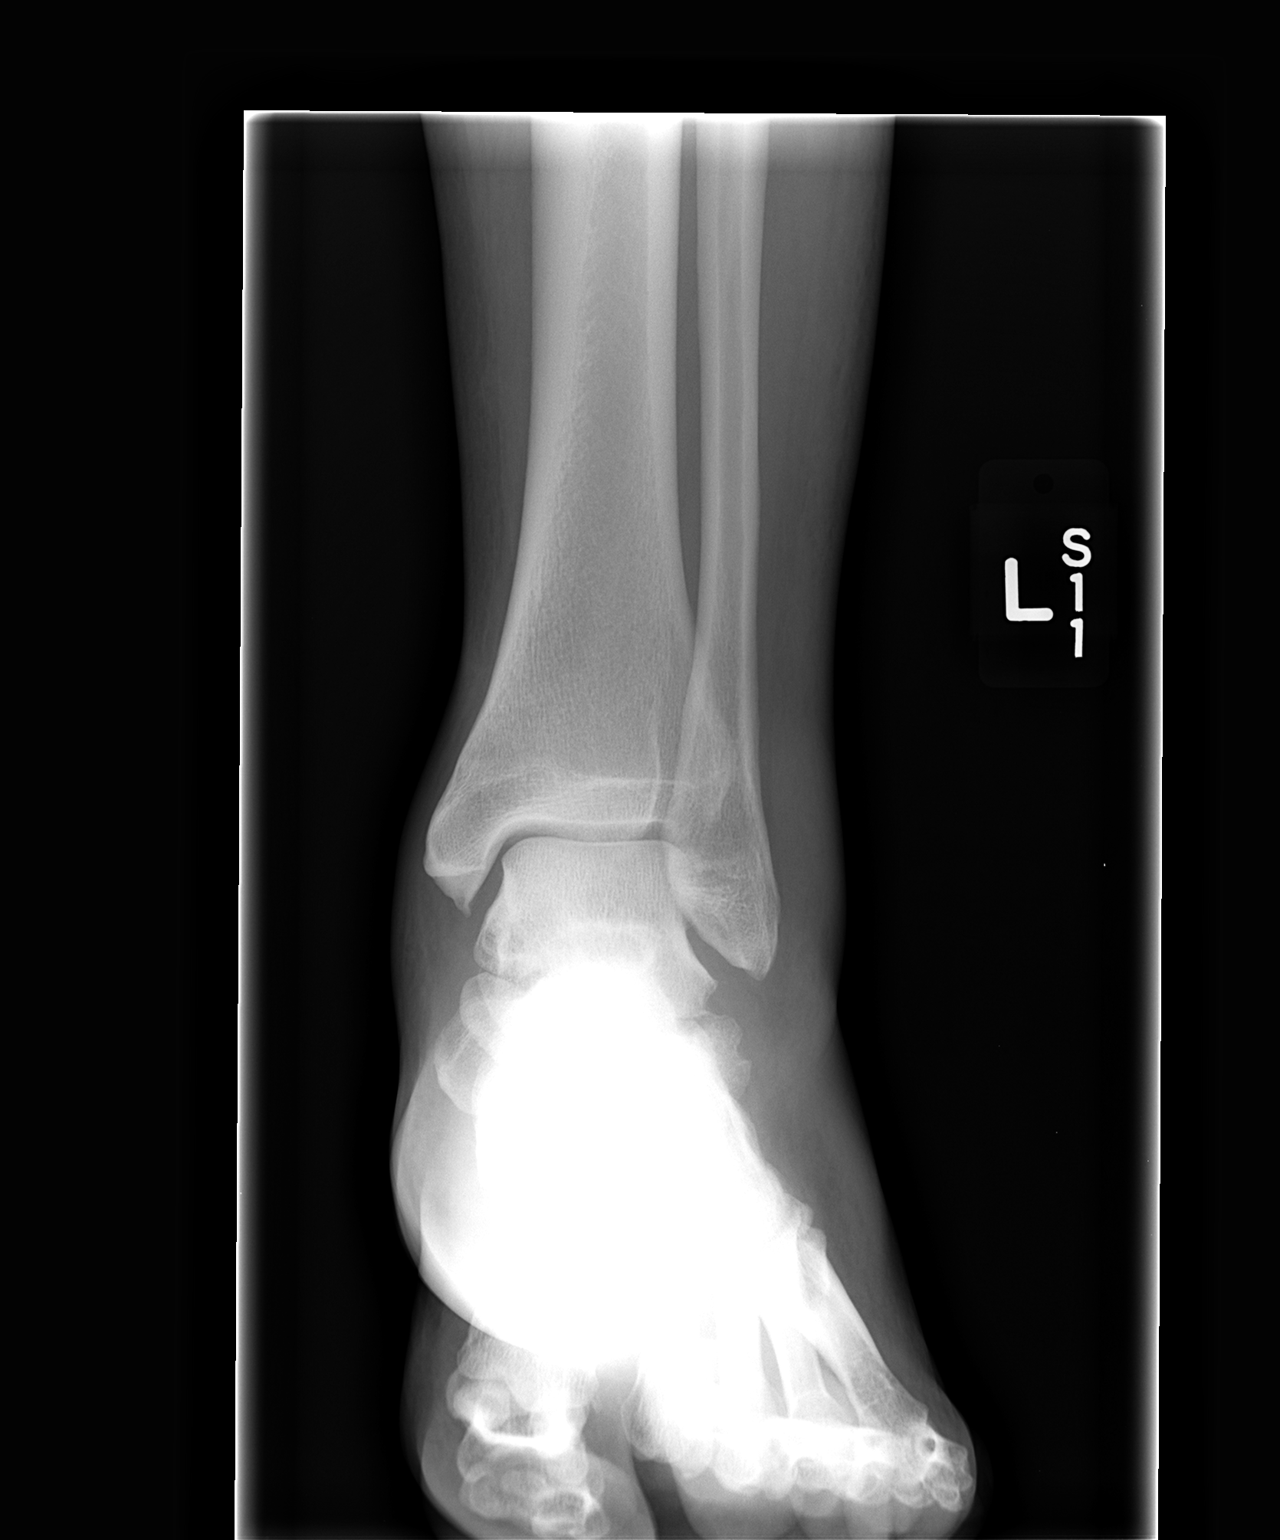

[view not recorded (2 of 3)]
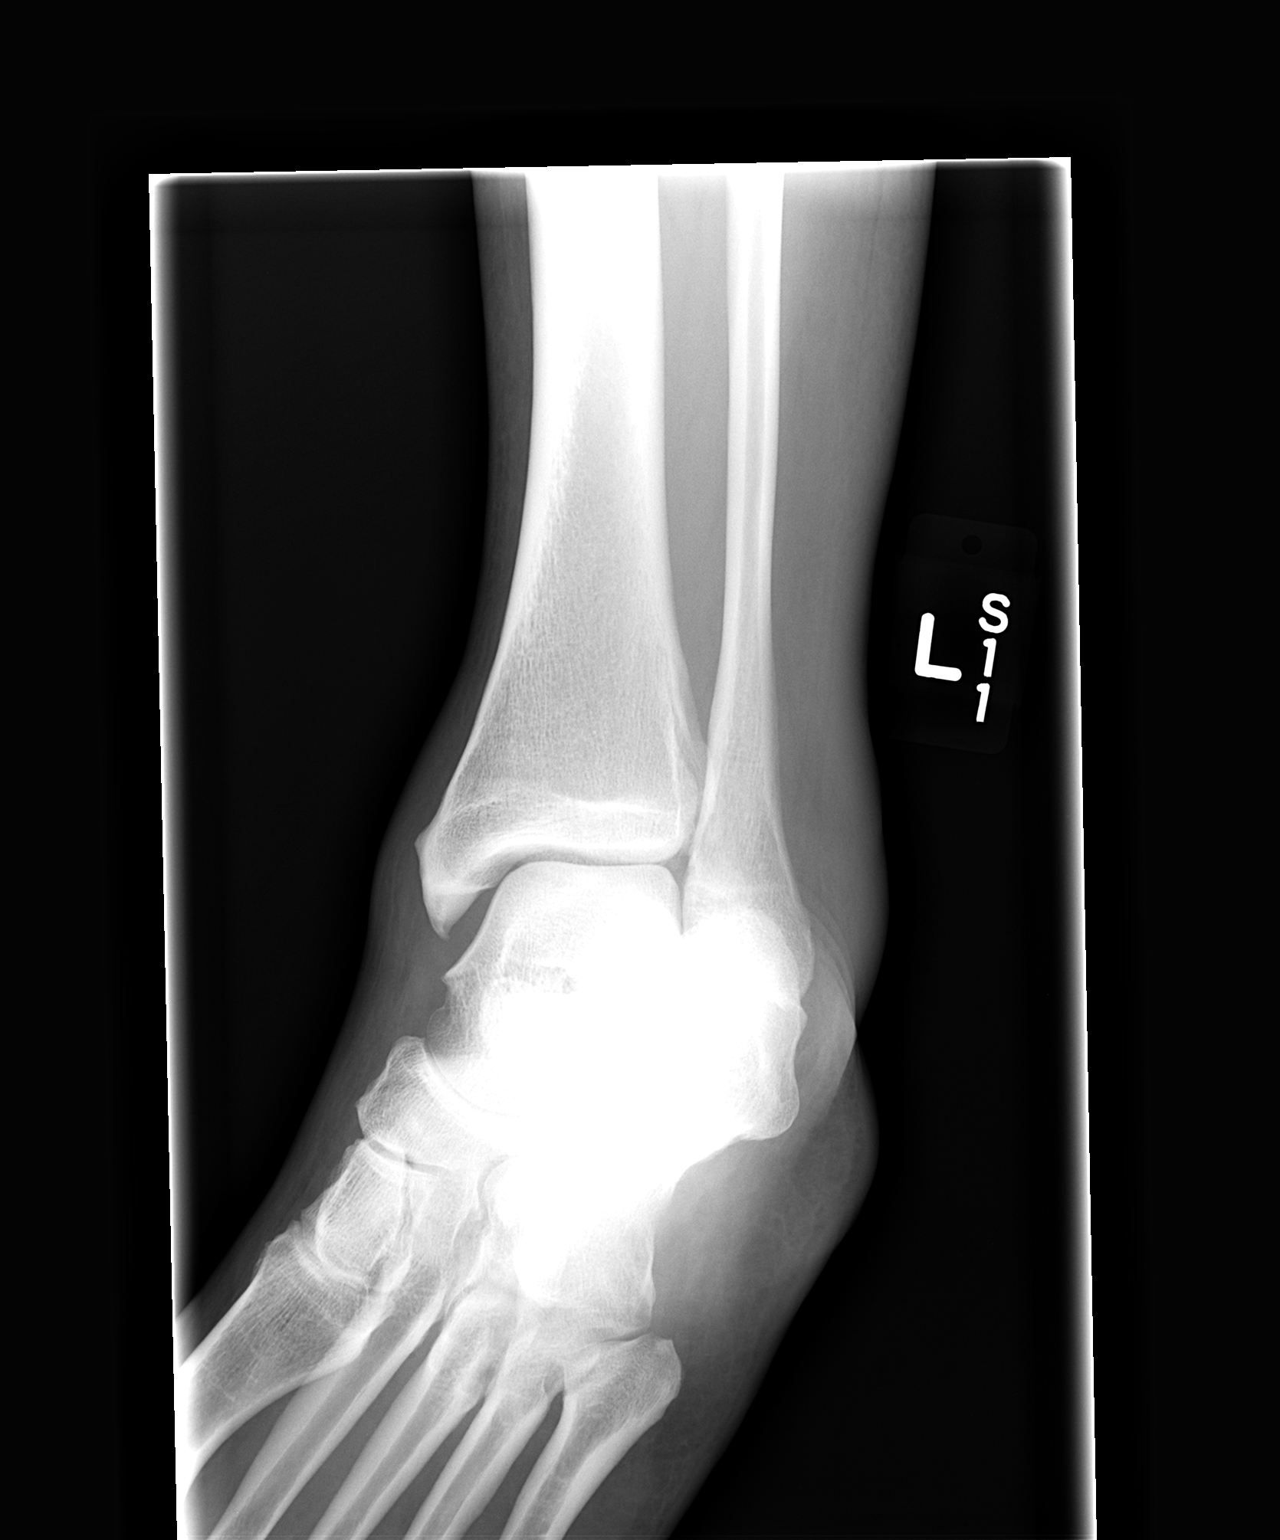

[view not recorded (3 of 3)]
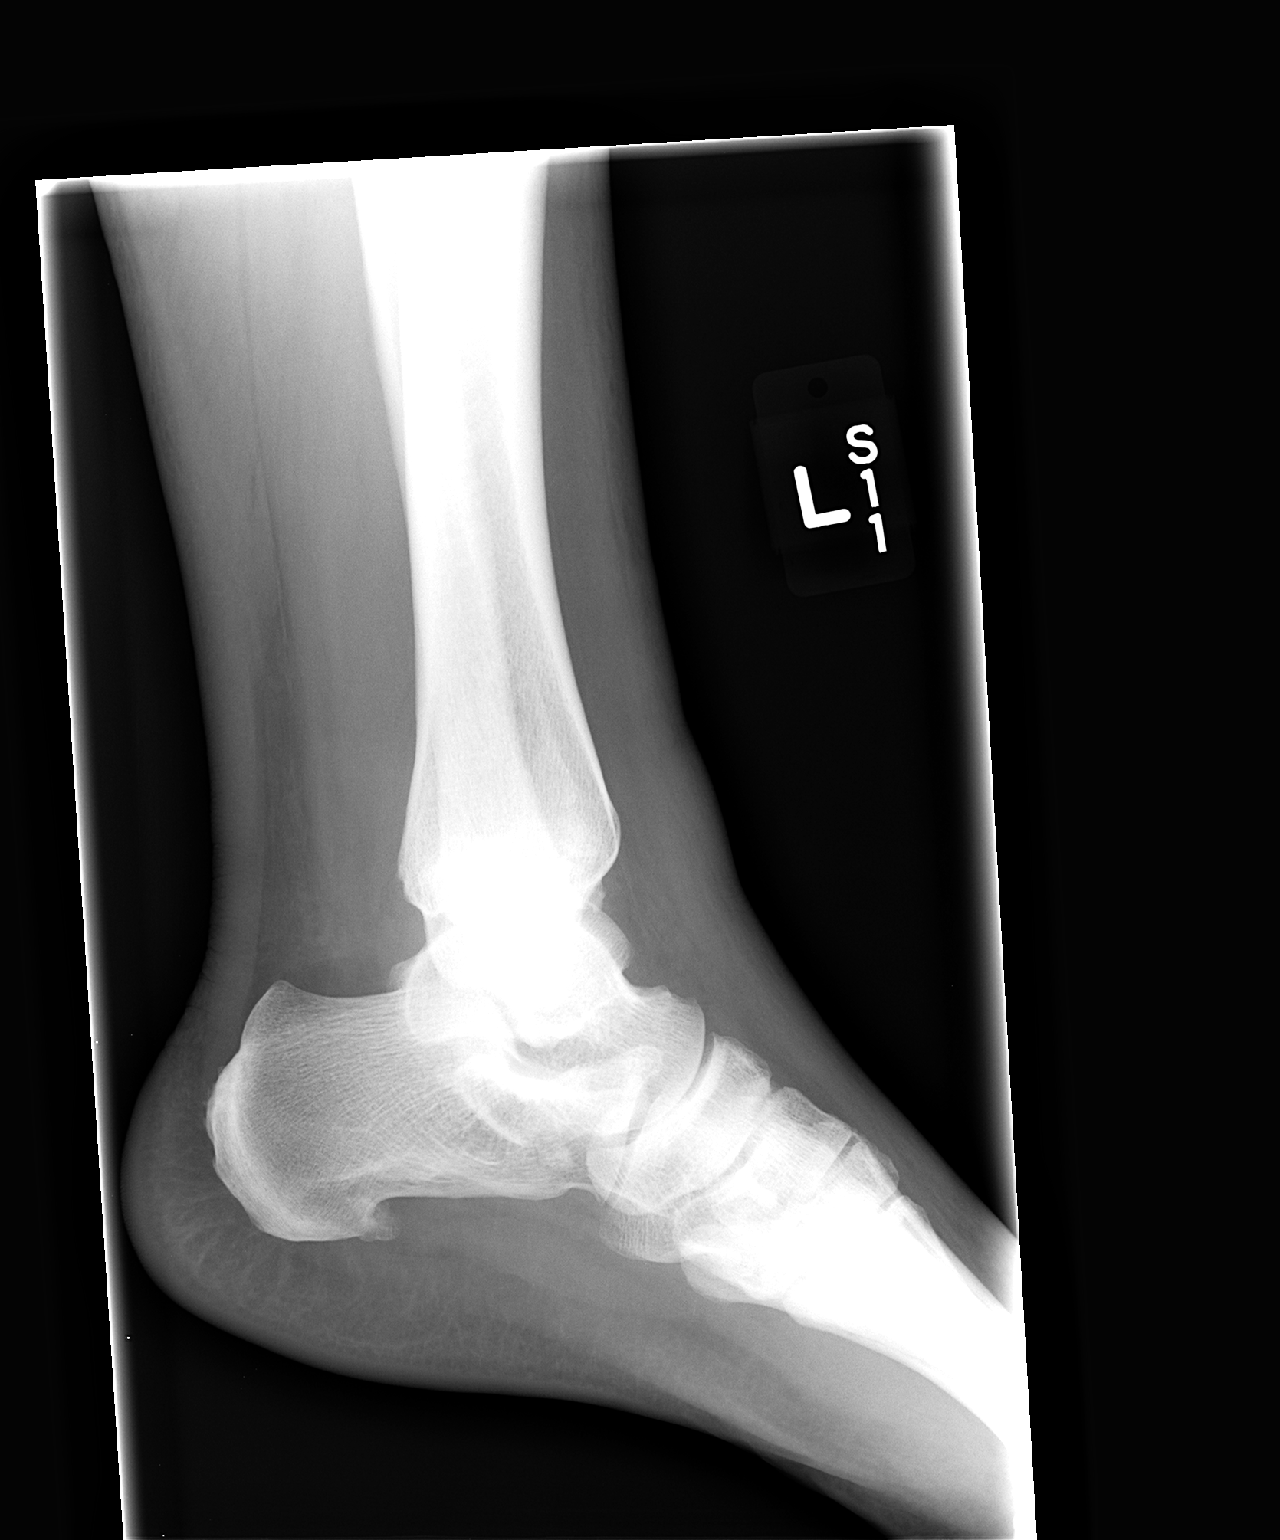

[3 of 3 positions shown; findings below may reference images not displayed]

CLINICAL DATA
Ankle pain.

EXAM
LEFT ANKLE COMPLETE - 3+ VIEW

COMPARISON
None.

FINDINGS
Lateral ankle soft tissue swelling. There is no evidence of
fracture, dislocation, or joint effusion. There is no evidence of
arthropathy or other focal bone abnormality.

IMPRESSION
No acute osseous findings.

SIGNATURE

## 2020-10-19 ENCOUNTER — Ambulatory Visit (INDEPENDENT_AMBULATORY_CARE_PROVIDER_SITE_OTHER): Payer: 59

## 2020-10-19 ENCOUNTER — Ambulatory Visit (INDEPENDENT_AMBULATORY_CARE_PROVIDER_SITE_OTHER): Payer: 59 | Admitting: Family

## 2020-10-19 DIAGNOSIS — M25562 Pain in left knee: Secondary | ICD-10-CM | POA: Diagnosis not present

## 2020-10-19 DIAGNOSIS — M1712 Unilateral primary osteoarthritis, left knee: Secondary | ICD-10-CM

## 2020-10-19 MED ORDER — METHYLPREDNISOLONE ACETATE 40 MG/ML IJ SUSP
40.0000 mg | INTRAMUSCULAR | Status: AC | PRN
  Administered 2020-10-19: 40 mg via INTRA_ARTICULAR

## 2020-10-19 MED ORDER — LIDOCAINE HCL 1 % IJ SOLN
5.0000 mL | INTRAMUSCULAR | Status: AC | PRN
  Administered 2020-10-19: 5 mL

## 2020-10-19 NOTE — Progress Notes (Signed)
Office Visit Note   Patient: Alex Holt           Date of Birth: 1965-10-14           MRN: [id number] Visit Date: 10/19/2020              Requested by: Salley Scarlet, MD [address] [place],  [place] [address] PCP: Madaline Brilliant, NP  Chief Complaint  Patient presents with  . Left Knee - Pain      HPI: The patient is a 55 year old gentleman who presents with a 3-week history of left knee pain.  It is not associated with any injury or twisting or pivoting accident he can recall.  He states he awoke 1 morning with a swollen knee this has very gradually improved.  He was seen by his primary care for the same they referred him to [place].  He was told that he may have osteoarthritis.  He has not had any locking catching or giving way he does have some popping and grinding.  Denies redness or warmth.  Associated with limping. no pain at rest. pain with weightbearing.  Pain with extension.  Assessment & Plan: Visit Diagnoses:  1. Acute pain of left knee   2. Primary osteoarthritis of left knee     Plan: Depo-Medrol injection left knee today.  Patient tolerated this well.  He will follow-up in the office in 4 weeks  Follow-Up Instructions: No follow-ups on file.   Left Knee Exam   Muscle Strength  The patient has normal left knee strength.  Tenderness  The patient is experiencing tenderness in the patella and lateral joint line.  Range of Motion  The patient has normal left knee ROM.  Tests  Varus: negative Valgus: negative  Other  Erythema: absent Swelling: moderate Effusion: effusion present      Patient is alert, oriented, no adenopathy, well-dressed, normal affect, normal respiratory effort.   Imaging: No results found. No images are attached to the encounter.  Labs: Lab Results  Component Value Date   HGBA1C 6.2 (H) 12/14/2013   HGBA1C 5.8 (H) 08/17/2013   HGBA1C 6.2 (H) 04/10/2012     Lab Results  Component Value Date   ALBUMIN 4.4  12/14/2013   ALBUMIN 4.2 08/17/2013   ALBUMIN 4.5 04/10/2012    No results found for: MG No results found for: VD25OH  No results found for: PREALBUMIN CBC EXTENDED Latest Ref Rng & Units 12/14/2013 08/17/2013 04/10/2012  WBC 4.0 - 10.5 K/uL 6.8 5.3 5.9  RBC 4.22 - 5.81 MIL/uL 5.74 5.67 5.50  HGB 13.0 - 17.0 g/dL 12.9(L) 12.5(L) 12.4(L)  HCT 39.0 - 52.0 % 39.9 39.1 38.2(L)  PLT 150 - 400 K/uL 403(H) 400 360  NEUTROABS 1.7 - 7.7 K/uL 4.1 3.1 -  LYMPHSABS 0.7 - 4.0 K/uL 2.2 1.8 -     There is no height or weight on file to calculate BMI.  Orders:  Orders Placed This Encounter  Procedures  . XR Knee 1-2 Views Left   No orders of the defined types were placed in this encounter.    Procedures: Large Joint Inj: L knee on 10/19/2020 2:22 PM Indications: pain Details: 18 G 1.5 in needle, anteromedial approach Medications: 5 mL lidocaine 1 %; 40 mg methylPREDNISolone acetate 40 MG/ML Aspirate: 0 mL Consent was given by the patient.      Clinical Data: No additional findings.  ROS:  All other systems negative, except as noted in the  HPI. Review of Systems  Constitutional: Negative for chills and fever.  Cardiovascular: Negative for leg swelling.  Musculoskeletal: Positive for arthralgias, gait problem and joint swelling.    Objective: Vital Signs: There were no vitals taken for this visit.  Specialty Comments:  No specialty comments available.  PMFS History: Patient Active Problem List   Diagnosis Date Noted  . Allergic rhinitis 12/23/2013  . Flea bite of multiple sites 12/23/2013  . Left ankle sprain 08/17/2013  . CTS (carpal tunnel syndrome) 04/29/2012  . Plantar fasciitis 04/10/2012  . Insomnia 11/14/2010  . Diabetes mellitus type 2 in obese (HCC) 11/14/2010  . Chronic mental illness 09/25/2010  . ANEMIA 03/08/2010  . GERD 03/08/2010  . HYPERLIPIDEMIA 01/30/2010  . OBESITY 01/30/2010  . HYPERTENSION 01/30/2010   Past Medical History:  Diagnosis Date   . Acid reflux   . Diabetes mellitus without complication (HCC)   . Hyperlipidemia   . Hypertension   . Schizophrenia (HCC)     Family History  Problem Relation Age of Onset  . Mental illness Other        FAMILY HISTORY   . Mental illness Mother   . Diabetes Other     No past surgical history on file. Social History   Occupational History  . Occupation: sELF EMPLOYED     Comment: [employer]   Tobacco Use  . Smoking status: Never Smoker  . Smokeless tobacco: Never Used  Substance and Sexual Activity  . Alcohol use: Yes    Comment: WEEKENDS   . Drug use: No  . Sexual activity: Yes

## 2021-07-11 ENCOUNTER — Other Ambulatory Visit: Payer: Self-pay

## 2021-07-11 ENCOUNTER — Ambulatory Visit (INDEPENDENT_AMBULATORY_CARE_PROVIDER_SITE_OTHER): Payer: 59 | Admitting: Orthopaedic Surgery

## 2021-07-11 ENCOUNTER — Encounter: Payer: Self-pay | Admitting: Orthopaedic Surgery

## 2021-07-11 DIAGNOSIS — M25562 Pain in left knee: Secondary | ICD-10-CM

## 2021-07-11 MED ORDER — LIDOCAINE HCL 1 % IJ SOLN
2.0000 mL | INTRAMUSCULAR | Status: AC | PRN
  Administered 2021-07-11: 2 mL

## 2021-07-11 MED ORDER — METHYLPREDNISOLONE ACETATE 40 MG/ML IJ SUSP
40.0000 mg | INTRAMUSCULAR | Status: AC | PRN
  Administered 2021-07-11: 40 mg via INTRA_ARTICULAR

## 2021-07-11 MED ORDER — BUPIVACAINE HCL 0.5 % IJ SOLN
2.0000 mL | INTRAMUSCULAR | Status: AC | PRN
  Administered 2021-07-11: 2 mL via INTRA_ARTICULAR

## 2021-07-11 NOTE — Progress Notes (Signed)
Office Visit Note   Patient: Alex Holt           Date of Birth: 1966/03/14           MRN: [id number] Visit Date: 07/11/2021              Requested by: No referring provider defined for this encounter. PCP: [name], NP (Inactive)   Assessment & Plan: Visit Diagnoses:  1. Acute pain of left knee     Plan: Impression is acute left knee pain with large effusion.  Overall x-rays do not show significant degenerative changes.  We aspirated approximately 45 cc of joint fluid which we will send to the lab.  He will take it easy until his symptoms improve.  We will notify him of the results of the fluid.  Follow-Up Instructions: No follow-ups on file.   Orders:  No orders of the defined types were placed in this encounter.  No orders of the defined types were placed in this encounter.     Procedures: Large Joint Inj: L knee on 07/11/2021 11:47 AM Details: 22 G needle Medications: 2 mL bupivacaine 0.5 %; 2 mL lidocaine 1 %; 40 mg methylPREDNISolone acetate 40 MG/ML Outcome: tolerated well, no immediate complications Patient was prepped and draped in the usual sterile fashion.      Clinical Data: No additional findings.   Subjective: Chief Complaint  Patient presents with   Left Knee - Pain    HPI  Alex Holt comes in today for left knee pain and swelling.  He was seen by Alex Holt in June of last year and provided with a cortisone injection which really helped.  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  Denies any injuries.  Denies history of gout.  Review of Systems  Constitutional: Negative.   All other systems reviewed and are negative.   Objective: Vital Signs: There were no vitals taken for this visit.  Physical Exam Vitals and nursing note reviewed.  Constitutional:      Appearance: He is well-developed.  Pulmonary:     Effort: Pulmonary effort is normal.  Abdominal:     Palpations: Abdomen is soft.  Skin:    General: Skin is warm.  Neurological:      Mental Status: He is alert and oriented to person, place, and time.  Psychiatric:        Behavior: Behavior normal.        Thought Content: Thought content normal.        Judgment: Judgment normal.    Ortho Exam  Examination of the left knee shows a large joint effusion.  Limited range of motion with pain.  No evidence of infection.  Specialty Comments:  No specialty comments available.  Imaging: No results found.   PMFS History: Patient Active Problem List   Diagnosis Date Noted   Allergic rhinitis 12/23/2013   Flea bite of multiple sites 12/23/2013   Left ankle sprain 08/17/2013   CTS (carpal tunnel syndrome) 04/29/2012   Plantar fasciitis 04/10/2012   Insomnia 11/14/2010   Diabetes mellitus type 2 in obese ([place]) 11/14/2010   Chronic mental illness 09/25/2010   ANEMIA 03/08/2010   GERD 03/08/2010   HYPERLIPIDEMIA 01/30/2010   OBESITY 01/30/2010   HYPERTENSION 01/30/2010   Past Medical History:  Diagnosis Date   Acid reflux    Diabetes mellitus without complication ([place])    Hyperlipidemia    Hypertension    Schizophrenia ([place])     Family History  Problem Relation  Age of Onset   Mental illness Other        FAMILY HISTORY    Mental illness Mother    Diabetes Other     History reviewed. No pertinent surgical history. Social History   Occupational History   Occupation: sELF EMPLOYED     Comment: Alex Holt   Tobacco Use   Smoking status: Never   Smokeless tobacco: Never  Substance and Sexual Activity   Alcohol use: Yes    Comment: WEEKENDS    Drug use: No   Sexual activity: Yes

## 2021-07-11 NOTE — Addendum Note (Signed)
Addended by: Albertina Parr on: 07/11/2021 02:06 PM   Modules accepted: Orders

## 2021-07-12 LAB — SYNOVIAL FLUID ANALYSIS, COMPLETE
Basophils, %: 0 %
Eosinophils-Synovial: 0 % (ref 0–2)
Lymphocytes-Synovial Fld: 24 % (ref 0–74)
Monocyte/Macrophage: 15 % (ref 0–69)
Neutrophil, Synovial: 61 % — ABNORMAL HIGH (ref 0–24)
Synoviocytes, %: 0 % (ref 0–15)
WBC, Synovial: 162 cells/uL — ABNORMAL HIGH (ref <150)

## 2021-07-17 LAB — ANAEROBIC AND AEROBIC CULTURE
AER RESULT:: NO GROWTH
MICRO NUMBER:: 13036964
MICRO NUMBER:: 13036965

## 2021-08-01 ENCOUNTER — Telehealth: Payer: Self-pay

## 2021-08-01 NOTE — Telephone Encounter (Signed)
Patient came into the office and wanted to know the results of his labs. He also stated that the area where he withdrew the fluid is still swollen.  ? ?Please advise  ?

## 2021-08-02 NOTE — Telephone Encounter (Signed)
The fluid shows some mild inflammation.  Did not show any abnormalities.

## 2021-08-02 NOTE — Telephone Encounter (Signed)
Does he need to follow up or PRN? ? ?

## 2021-08-02 NOTE — Telephone Encounter (Signed)
prn

## 2021-08-02 NOTE — Telephone Encounter (Signed)
Called patient no answer LMOM.

## 2021-08-04 NOTE — Telephone Encounter (Signed)
Advised pt per Marisue Ivan, this was just inflamation  and to follow up as needed  ?

## 2021-08-28 ENCOUNTER — Ambulatory Visit (INDEPENDENT_AMBULATORY_CARE_PROVIDER_SITE_OTHER): Payer: 59 | Admitting: Orthopedic Surgery

## 2021-08-28 ENCOUNTER — Ambulatory Visit (INDEPENDENT_AMBULATORY_CARE_PROVIDER_SITE_OTHER): Payer: 59

## 2021-08-28 DIAGNOSIS — M25562 Pain in left knee: Secondary | ICD-10-CM

## 2021-08-28 DIAGNOSIS — M25462 Effusion, left knee: Secondary | ICD-10-CM

## 2021-08-29 ENCOUNTER — Encounter: Payer: Self-pay | Admitting: Orthopedic Surgery

## 2021-08-29 DIAGNOSIS — M25462 Effusion, left knee: Secondary | ICD-10-CM | POA: Diagnosis not present

## 2021-08-29 MED ORDER — LIDOCAINE HCL (PF) 1 % IJ SOLN
5.0000 mL | INTRAMUSCULAR | Status: AC | PRN
  Administered 2021-08-29: 5 mL

## 2021-08-29 MED ORDER — METHYLPREDNISOLONE ACETATE 40 MG/ML IJ SUSP
40.0000 mg | INTRAMUSCULAR | Status: AC | PRN
  Administered 2021-08-29: 40 mg via INTRA_ARTICULAR

## 2021-08-29 NOTE — Progress Notes (Signed)
? ?Office Visit Note ?  ?Patient: Alex Holt           ?Date of Birth: 12/09/1965           ?MRN: [id number] ?Visit Date: 08/28/2021 ?             ?Requested by: No referring provider defined for this encounter. ?PCP: Madaline Brilliant, NP (Inactive) ? ?Chief Complaint  ?Patient presents with  ? Left Knee - Pain  ? ? ? ? ?HPI: ?Patient is a 56 year old gentleman who presents with a recurrent effusion of the left knee.  Patient denies any trauma.  Patient states he saw Dr. Roda Shutters on February 21 with knee aspiration 45 cc.  Patient states he has been using ice but has developed a recurrent effusion. ? ?Assessment & Plan: ?Visit Diagnoses:  ?1. Acute pain of left knee   ?2. Effusion, left knee   ? ? ?Plan: Knee was aspirated of clear synovial fluid and injected.  Follow-up in 4 weeks. ? ?Follow-Up Instructions: Return in about 4 weeks (around 09/25/2021).  ? ?Ortho Exam ? ?Patient is alert, oriented, no adenopathy, well-dressed, normal affect, normal respiratory effort. ?Examination patient does have a moderate effusion of the left knee there is no redness no cellulitis he has no pain with active or passive range of motion of the knee medial lateral joint lines are minimally tender to palpation.  The knee was aspirated from the superior lateral portal with 40 cc of clear synovial fluid no signs of infection last hemoglobin A1c was 6.28 years ago.  Aspiration from February showed no organisms. ? ?Imaging: ?XR Knee 1-2 Views Left ? ?Result Date: 08/29/2021 ?2 view radiographs of the left knee shows accessory ossification centers superior lateral patella.  There is medial joint space narrowing with subcondylar sclerosis.  ?No images are attached to the encounter. ? ?Labs: ?Lab Results  ?Component Value Date  ? HGBA1C 6.2 (H) 12/14/2013  ? HGBA1C 5.8 (H) 08/17/2013  ? HGBA1C 6.2 (H) 04/10/2012  ? ? ? ?Lab Results  ?Component Value Date  ? ALBUMIN 4.4 12/14/2013  ? ALBUMIN 4.2 08/17/2013  ? ALBUMIN 4.5 04/10/2012  ? ? ?No results  found for: MG ?No results found for: VD25OH ? ?No results found for: PREALBUMIN ? ?  Latest Ref Rng & Units 12/14/2013  ?  2:15 PM 08/17/2013  ? 12:04 PM 04/10/2012  ?  8:50 AM  ?CBC EXTENDED  ?WBC 4.0 - 10.5 K/uL 6.8   5.3   5.9    ?RBC 4.22 - 5.81 MIL/uL 5.74   5.67   5.50    ? 5.47    ?Hemoglobin 13.0 - 17.0 g/dL 53.2   02.3   34.3    ?HCT 39.0 - 52.0 % 39.9   39.1   38.2    ?Platelets 150 - 400 K/uL 403   400   360    ?NEUT# 1.7 - 7.7 K/uL 4.1   3.1     ?Lymph# 0.7 - 4.0 K/uL 2.2   1.8     ? ? ? ?There is no height or weight on file to calculate BMI. ? ?Orders:  ?Orders Placed This Encounter  ?Procedures  ? XR Knee 1-2 Views Left  ? ?No orders of the defined types were placed in this encounter. ? ? ? Procedures: ?Large Joint Inj: L knee on 08/29/2021 11:21 AM ?Indications: pain and diagnostic evaluation ?Details: 22 G 1.5 in needle, superolateral approach ? ?Arthrogram: No ? ?Medications: 5 mL lidocaine (  PF) 1 %; 40 mg methylPREDNISolone acetate 40 MG/ML ?Aspirate: 40 mL clear ?Outcome: tolerated well, no immediate complications ?Procedure, treatment alternatives, risks and benefits explained, specific risks discussed. Consent was given by the patient. Immediately prior to procedure a time out was called to verify the correct patient, procedure, equipment, support staff and site/side marked as required. Patient was prepped and draped in the usual sterile fashion.  ? ? ? ?Clinical Data: ?No additional findings. ? ?ROS: ? ?All other systems negative, except as noted in the HPI. ?Review of Systems ? ?Objective: ?Vital Signs: There were no vitals taken for this visit. ? ?Specialty Comments:  ?No specialty comments available. ? ?PMFS History: ?Patient Active Problem List  ? Diagnosis Date Noted  ? Allergic rhinitis 12/23/2013  ? Flea bite of multiple sites 12/23/2013  ? Left ankle sprain 08/17/2013  ? CTS (carpal tunnel syndrome) 04/29/2012  ? Plantar fasciitis 04/10/2012  ? Insomnia 11/14/2010  ? Diabetes mellitus type  2 in obese (HCC) 11/14/2010  ? Chronic mental illness 09/25/2010  ? ANEMIA 03/08/2010  ? GERD 03/08/2010  ? HYPERLIPIDEMIA 01/30/2010  ? OBESITY 01/30/2010  ? HYPERTENSION 01/30/2010  ? ?Past Medical History:  ?Diagnosis Date  ? Acid reflux   ? Diabetes mellitus without complication (HCC)   ? Hyperlipidemia   ? Hypertension   ? Schizophrenia (HCC)   ?  ?Family History  ?Problem Relation Age of Onset  ? Mental illness Other   ?     FAMILY HISTORY   ? Mental illness Mother   ? Diabetes Other   ?  ?History reviewed. No pertinent surgical history. ?Social History  ? ?Occupational History  ? Occupation: sELF EMPLOYED   ?  Comment: [employer]   ?Tobacco Use  ? Smoking status: Never  ? Smokeless tobacco: Never  ?Substance and Sexual Activity  ? Alcohol use: Yes  ?  Comment: WEEKENDS   ? Drug use: No  ? Sexual activity: Yes  ? ? ? ? ? ?

## 2021-11-14 ENCOUNTER — Ambulatory Visit: Payer: 59 | Admitting: Family

## 2021-11-14 DIAGNOSIS — M25462 Effusion, left knee: Secondary | ICD-10-CM | POA: Diagnosis not present

## 2021-11-14 DIAGNOSIS — M25562 Pain in left knee: Secondary | ICD-10-CM | POA: Diagnosis not present

## 2021-11-14 NOTE — Progress Notes (Signed)
Office Visit Note   Patient: Alex Holt           Date of Birth: 22-Jun-1965           MRN: [id number] Visit Date: 11/14/2021              Requested by: No referring provider defined for this encounter. PCP: Madaline Brilliant, NP (Inactive)  Chief Complaint  Patient presents with   Left Knee - Pain      HPI: Patient is a 56 year old gentleman who is well-known to our office he intermittently has issues with effusions of his left knee.  He states that he began having difficulty a couple weeks ago this was worse last week unfortunately he was unable to get an appointment until this week.  He states that over the last 1 week he has noticed a gradual decline in the amount of swelling and fluid on his knee.  While the edema was present he was having pain with flexion and difficulty with weightbearing and increased pain difficulty with ADLs.  Is requesting aspiration injection today.  Assessment & Plan: Visit Diagnoses: No diagnosis found.  Plan: Aspiration 10 cc synovial fluid.  Patient tolerated well.  Follow-up as needed.  Follow-Up Instructions: No follow-ups on file.   Left Knee Exam   Muscle Strength  The patient has normal left knee strength.  Tenderness  The patient is experiencing tenderness in the lateral joint line and medial joint line.  Range of Motion  The patient has normal left knee ROM.  Other  Swelling: mild Effusion: effusion present      Patient is alert, oriented, no adenopathy, well-dressed, normal affect, normal respiratory effort.   Imaging: No results found. No images are attached to the encounter.  Labs: Lab Results  Component Value Date   HGBA1C 6.2 (H) 12/14/2013   HGBA1C 5.8 (H) 08/17/2013   HGBA1C 6.2 (H) 04/10/2012     Lab Results  Component Value Date   ALBUMIN 4.4 12/14/2013   ALBUMIN 4.2 08/17/2013   ALBUMIN 4.5 04/10/2012    No results found for: "MG" No results found for: "VD25OH"  No results found for:  "PREALBUMIN"    Latest Ref Rng & Units 12/14/2013    2:15 PM 08/17/2013   12:04 PM 04/10/2012    8:50 AM  CBC EXTENDED  WBC 4.0 - 10.5 K/uL 6.8  5.3  5.9   RBC 4.22 - 5.81 MIL/uL 5.74  5.67  5.50    5.47   Hemoglobin 13.0 - 17.0 g/dL 29.5  28.4  13.2   HCT 39.0 - 52.0 % 39.9  39.1  38.2   Platelets 150 - 400 K/uL 403  400  360   NEUT# 1.7 - 7.7 K/uL 4.1  3.1    Lymph# 0.7 - 4.0 K/uL 2.2  1.8       There is no height or weight on file to calculate BMI.  Orders:  No orders of the defined types were placed in this encounter.  No orders of the defined types were placed in this encounter.    Procedures: Large Joint Inj: L knee on 11/16/2021 9:00 PM Indications: pain Details: 18 G 1.5 in needle, superolateral approach Medications: 1 mL lidocaine 1 % Aspirate: 10 mL serous Consent was given by the patient.      Clinical Data: No additional findings.  ROS:  All other systems negative, except as noted in the HPI. Review of Systems  Objective: Vital Signs: There were no  vitals taken for this visit.  Specialty Comments:  No specialty comments available.  PMFS History: Patient Active Problem List   Diagnosis Date Noted   Allergic rhinitis 12/23/2013   Flea bite of multiple sites 12/23/2013   Left ankle sprain 08/17/2013   CTS (carpal tunnel syndrome) 04/29/2012   Plantar fasciitis 04/10/2012   Insomnia 11/14/2010   Diabetes mellitus type 2 in obese (HCC) 11/14/2010   Chronic mental illness 09/25/2010   ANEMIA 03/08/2010   GERD 03/08/2010   HYPERLIPIDEMIA 01/30/2010   OBESITY 01/30/2010   HYPERTENSION 01/30/2010   Past Medical History:  Diagnosis Date   Acid reflux    Diabetes mellitus without complication (HCC)    Hyperlipidemia    Hypertension    Schizophrenia (HCC)     Family History  Problem Relation Age of Onset   Mental illness Other        FAMILY HISTORY    Mental illness Mother    Diabetes Other     No past surgical history on file. Social  History   Occupational History   Occupation: sELF EMPLOYED     Comment: [employer]   Tobacco Use   Smoking status: Never   Smokeless tobacco: Never  Substance and Sexual Activity   Alcohol use: Yes    Comment: WEEKENDS    Drug use: No   Sexual activity: Yes

## 2021-11-16 ENCOUNTER — Encounter: Payer: Self-pay | Admitting: Family

## 2021-11-16 DIAGNOSIS — M25462 Effusion, left knee: Secondary | ICD-10-CM

## 2021-11-16 MED ORDER — LIDOCAINE HCL 1 % IJ SOLN
1.0000 mL | INTRAMUSCULAR | Status: AC | PRN
  Administered 2021-11-16: 1 mL

## 2022-01-25 ENCOUNTER — Encounter: Payer: Self-pay | Admitting: Physician Assistant

## 2022-01-25 ENCOUNTER — Ambulatory Visit: Payer: Commercial Managed Care - HMO | Admitting: Physician Assistant

## 2022-01-25 DIAGNOSIS — M1712 Unilateral primary osteoarthritis, left knee: Secondary | ICD-10-CM | POA: Insufficient documentation

## 2022-01-25 MED ORDER — METHYLPREDNISOLONE ACETATE 40 MG/ML IJ SUSP
80.0000 mg | INTRAMUSCULAR | Status: AC | PRN
  Administered 2022-01-25: 80 mg via INTRA_ARTICULAR

## 2022-01-25 MED ORDER — BUPIVACAINE HCL 0.25 % IJ SOLN
2.0000 mL | INTRAMUSCULAR | Status: AC | PRN
  Administered 2022-01-25: 2 mL via INTRA_ARTICULAR

## 2022-01-25 MED ORDER — LIDOCAINE HCL 1 % IJ SOLN
2.0000 mL | INTRAMUSCULAR | Status: AC | PRN
  Administered 2022-01-25: 2 mL

## 2022-01-25 NOTE — Progress Notes (Signed)
Office Visit Note   Patient: Alex Holt           Date of Birth: 1965/09/30           MRN: [id number] Visit Date: 01/25/2022              Requested by: No referring provider defined for this encounter. PCP: Madaline Brilliant, NP (Inactive)  Chief Complaint  Patient presents with  . Left Knee - Follow-up      HPI: Patient is a pleasant 56 year old gentleman with a history of left knee pain and arthritis.  He comes in periodically for an aspiration and injection of steroid.  Last visit was in May at which time 10 cc of synovial fluid was aspirated he was injected.  He comes in today complaining of the same.  He denies any recent fever chills or illness.  He did have cultures and white count taken of the fluid when it was aspirated by Dr. Roda Shutters in February.  Cultures were negative.  Assessment & Plan: Visit Diagnoses:  1. Unilateral primary osteoarthritis, left knee     Plan: Recurrent effusion left knee arthritis.  We will go forward and aspirate and inject his knee today can follow-up with Dr. Roda Shutters as needed  Follow-Up Instructions: No follow-ups on file.   Ortho Exam  Patient is alert, oriented, no adenopathy, well-dressed, normal affect, normal respiratory effort. Left knee no erythema no cellulitis compartments are soft and nontender.  He does have a large effusion.  No real warmth.  Imaging: No results found. No images are attached to the encounter.  Labs: Lab Results  Component Value Date   HGBA1C 6.2 (H) 12/14/2013   HGBA1C 5.8 (H) 08/17/2013   HGBA1C 6.2 (H) 04/10/2012     Lab Results  Component Value Date   ALBUMIN 4.4 12/14/2013   ALBUMIN 4.2 08/17/2013   ALBUMIN 4.5 04/10/2012    No results found for: "MG" No results found for: "VD25OH"  No results found for: "PREALBUMIN"    Latest Ref Rng & Units 12/14/2013    2:15 PM 08/17/2013   12:04 PM 04/10/2012    8:50 AM  CBC EXTENDED  WBC 4.0 - 10.5 K/uL 6.8  5.3  5.9   RBC 4.22 - 5.81 MIL/uL 5.74  5.67   5.50    5.47   Hemoglobin 13.0 - 17.0 g/dL 40.9  81.1  91.4   HCT 39.0 - 52.0 % 39.9  39.1  38.2   Platelets 150 - 400 K/uL 403  400  360   NEUT# 1.7 - 7.7 K/uL 4.1  3.1    Lymph# 0.7 - 4.0 K/uL 2.2  1.8       There is no height or weight on file to calculate BMI.  Orders:  No orders of the defined types were placed in this encounter.  No orders of the defined types were placed in this encounter.    Procedures: Large Joint Inj: L knee on 01/25/2022 9:53 AM Indications: pain and diagnostic evaluation Details: 25 G 1.5 in needle, superolateral approach  Arthrogram: No  Medications: 80 mg methylPREDNISolone acetate 40 MG/ML; 2 mL lidocaine 1 %; 2 mL bupivacaine 0.25 % Aspirate: 40 mL yellow Outcome: tolerated well, no immediate complications  After obtaining verbal consent superior lateral portion of patella was prepped with alcohol followed by Betadine.  He was injected with 3 cc of lidocaine plain.  After adequate anesthesia was achieved he was reprepped with Betadine.  18-gauge needle was inserted  and 40 cc of yellow slightly blood-tinged fluid was aspirated.  He was then injected with 80 mg Depo-Medrol.  Band-Aid was placed in a compression Ace wrap.  Patient tolerated the procedure well Procedure, treatment alternatives, risks and benefits explained, specific risks discussed. Consent was given by the patient. Immediately prior to procedure a time out was called to verify the correct patient, procedure, equipment, support staff and site/side marked as required. Patient was prepped and draped in the usual sterile fashion.    Clinical Data: No additional findings.  ROS:  All other systems negative, except as noted in the HPI. Review of Systems  Objective: Vital Signs: There were no vitals taken for this visit.  Specialty Comments:  No specialty comments available.  PMFS History: Patient Active Problem List   Diagnosis Date Noted  . Unilateral primary osteoarthritis,  left knee 01/25/2022  . Allergic rhinitis 12/23/2013  . Flea bite of multiple sites 12/23/2013  . Left ankle sprain 08/17/2013  . CTS (carpal tunnel syndrome) 04/29/2012  . Plantar fasciitis 04/10/2012  . Insomnia 11/14/2010  . Diabetes mellitus type 2 in obese (HCC) 11/14/2010  . Chronic mental illness 09/25/2010  . ANEMIA 03/08/2010  . GERD 03/08/2010  . HYPERLIPIDEMIA 01/30/2010  . OBESITY 01/30/2010  . HYPERTENSION 01/30/2010   Past Medical History:  Diagnosis Date  . Acid reflux   . Diabetes mellitus without complication (HCC)   . Hyperlipidemia   . Hypertension   . Schizophrenia ([facility])     Family History  Problem Relation Age of Onset  . Mental illness Other        FAMILY HISTORY   . Mental illness Mother   . Diabetes Other     History reviewed. No pertinent surgical history. Social History   Occupational History  . Occupation: sELF EMPLOYED     Comment: [employer]   Tobacco Use  . Smoking status: Never  . Smokeless tobacco: Never  Substance and Sexual Activity  . Alcohol use: Yes    Comment: WEEKENDS   . Drug use: No  . Sexual activity: Yes

## 2022-02-05 ENCOUNTER — Encounter: Payer: Self-pay | Admitting: Emergency Medicine

## 2022-02-05 ENCOUNTER — Ambulatory Visit
Admission: EM | Admit: 2022-02-05 | Discharge: 2022-02-05 | Disposition: A | Discharge status: Home / Self Care | Payer: Commercial Managed Care - HMO

## 2022-02-05 ENCOUNTER — Other Ambulatory Visit: Payer: Self-pay

## 2022-02-05 DIAGNOSIS — J329 Chronic sinusitis, unspecified: Secondary | ICD-10-CM | POA: Diagnosis not present

## 2022-02-05 DIAGNOSIS — J309 Allergic rhinitis, unspecified: Secondary | ICD-10-CM

## 2022-02-05 DIAGNOSIS — J310 Chronic rhinitis: Secondary | ICD-10-CM | POA: Diagnosis not present

## 2022-02-05 MED ORDER — FLUTICASONE PROPIONATE 50 MCG/ACT NA SUSP: 1.0000 | Freq: Every day | NASAL | 2 refills | Status: AC

## 2022-02-05 MED ORDER — CETIRIZINE HCL 10 MG PO TABS: 10.0000 mg | ORAL_TABLET | Freq: Every day | ORAL | 1 refills | Status: DC

## 2022-02-05 MED ORDER — DEXAMETHASONE SODIUM PHOSPHATE 10 MG/ML IJ SOLN
10.0000 mg | Freq: Once | INTRAMUSCULAR | Status: AC
  Administered 2022-02-05: 10 mg via INTRAMUSCULAR

## 2022-02-05 NOTE — ED Provider Notes (Signed)
[facility]    CSN: [id number] Arrival date & time: 02/05/22  1032    HISTORY   Chief Complaint  Patient presents with   Cough   HPI Alex Holt is a pleasant, 56 y.o. male who presents to urgent care today. Patient reports a 1 week history of sinus congestion, sinus pressure and sinus drainage.  Patient has a significantly elevated blood pressure on arrival with otherwise normal vital signs.  Patient has a history of allergic rhinitis, not currently taking any allergy medication for this.   Cough  Past Medical History:  Diagnosis Date   Acid reflux    Diabetes mellitus without complication ([place])    Hyperlipidemia    Hypertension    Schizophrenia [facility])    Patient Active Problem List   Diagnosis Date Noted   Unilateral primary osteoarthritis, left knee 01/25/2022   Allergic rhinitis 12/23/2013   Flea bite of multiple sites 12/23/2013   Left ankle sprain 08/17/2013   CTS (carpal tunnel syndrome) 04/29/2012   Plantar fasciitis 04/10/2012   Insomnia 11/14/2010   Diabetes mellitus type 2 in obese ([place]) 11/14/2010   Chronic mental illness 09/25/2010   ANEMIA 03/08/2010   GERD 03/08/2010   HYPERLIPIDEMIA 01/30/2010   OBESITY 01/30/2010   HYPERTENSION 01/30/2010   History reviewed. No pertinent surgical history.  Home Medications    Prior to Admission medications   Medication Sig Start Date End Date Taking? Authorizing Provider  amLODipine (NORVASC) 5 MG tablet Take 1 tablet (5 mg total) by mouth daily. 08/17/14   Alycia Rossetti, MD  atorvastatin (LIPITOR) 20 MG tablet Take 1 tablet (20 mg total) by mouth at bedtime. For cholesterol 09/07/13   [place], Modena Nunnery, MD  fluticasone [facility]) 50 MCG/ACT nasal spray Place 2 sprays into both nostrils daily. 12/23/13   Alycia Rossetti, MD  hydrochlorothiazide (HYDRODIURIL) 25 MG tablet Take 1 tablet (25 mg total) by mouth daily. 12/23/13   Alycia Rossetti, MD  IRON PO Take 45 mg by mouth.    [provider]  metFORMIN (GLUCOPHAGE) 500 MG tablet SMARTSIG:1 Tablet(s) By Mouth Morning-Evening 12/28/21   [provider]  naproxen (NAPROSYN) 500 MG tablet TAKE ONE TABLET BY MOUTH TWICE DAILY WITH A MEAL 01/14/14   [place], Modena Nunnery, MD  traMADol (ULTRAM) 50 MG tablet Take 1 tablet (50 mg total) by mouth every 6 (six) hours as needed. Patient not taking: Reported on 02/05/2022 08/17/13   Alycia Rossetti, MD  triamcinolone cream (KENALOG) 0.1 % Apply 1 application topically 2 (two) times daily. 05/18/14   Alycia Rossetti, MD    Family History Family History  Problem Relation Age of Onset   Mental illness Mother    Mental illness Other        FAMILY HISTORY    Diabetes Other    Social History Social History   Tobacco Use   Smoking status: Never   Smokeless tobacco: Never  Vaping Use   Vaping Use: Never used  Substance Use Topics   Alcohol use: Yes    Comment: WEEKENDS    Drug use: No   Allergies   Patient has no known allergies.  Review of Systems Review of Systems  Respiratory:  Positive for cough.    Pertinent findings revealed after performing a 14 point review of systems has been noted in the history of present illness.  Physical Exam Triage Vital Signs ED Triage Vitals  Enc Vitals Group     BP 03/17/21  0827 (!) 147/82     Pulse Rate 03/17/21 0827 72     Resp 03/17/21 0827 18     Temp 03/17/21 0827 98.3 F (36.8 C)     Temp Source 03/17/21 0827 Oral     SpO2 03/17/21 0827 98 %     Weight --      Height --      Head Circumference --      Peak Flow --      Pain Score 03/17/21 0826 5     Pain Loc --      Pain Edu? --      Excl. in GC? --   No data found.  Updated Vital Signs BP (!) 155/97 (BP Location: Right Arm)   Pulse 90   Temp 98.9 F (37.2 C) (Oral)   Resp 16   SpO2 97%   Physical Exam Vitals and nursing note reviewed.  Constitutional:      General: He is not in acute distress.    Appearance: Normal appearance. He is not  ill-appearing.  HENT:     Head: Normocephalic and atraumatic.     Salivary Glands: Right salivary gland is not diffusely enlarged or tender. Left salivary gland is not diffusely enlarged or tender.     Right Ear: Ear canal and external ear normal. No drainage. A middle ear effusion is present. There is no impacted cerumen. Tympanic membrane is bulging. Tympanic membrane is not injected or erythematous.     Left Ear: Ear canal and external ear normal. No drainage. A middle ear effusion is present. There is no impacted cerumen. Tympanic membrane is bulging. Tympanic membrane is not injected or erythematous.     Ears:     Comments: Bilateral EACs normal, both TMs bulging with clear fluid    Nose: Rhinorrhea present. No nasal deformity, septal deviation, signs of injury, nasal tenderness, mucosal edema or congestion. Rhinorrhea is clear.     Right Nostril: Occlusion present. No foreign body, epistaxis or septal hematoma.     Left Nostril: Occlusion present. No foreign body, epistaxis or septal hematoma.     Right Turbinates: Enlarged, swollen and pale.     Left Turbinates: Enlarged, swollen and pale.     Right Sinus: No maxillary sinus tenderness or frontal sinus tenderness.     Left Sinus: No maxillary sinus tenderness or frontal sinus tenderness.     Mouth/Throat:     Lips: Pink. No lesions.     Mouth: Mucous membranes are moist. No oral lesions.     Pharynx: Oropharynx is clear. Uvula midline. No posterior oropharyngeal erythema or uvula swelling.     Tonsils: No tonsillar exudate. 0 on the right. 0 on the left.     Comments: Postnasal drip Eyes:     General: Lids are normal.        Right eye: No discharge.        Left eye: No discharge.     Extraocular Movements: Extraocular movements intact.     Conjunctiva/sclera: Conjunctivae normal.     Right eye: Right conjunctiva is not injected.     Left eye: Left conjunctiva is not injected.  Neck:     Trachea: Trachea and phonation normal.   Cardiovascular:     Rate and Rhythm: Normal rate and regular rhythm.     Pulses: Normal pulses.     Heart sounds: Normal heart sounds. No murmur heard.    No friction rub. No gallop.  Pulmonary:  Effort: Pulmonary effort is normal. No accessory muscle usage, prolonged expiration or respiratory distress.     Breath sounds: Normal breath sounds. No stridor, decreased air movement or transmitted upper airway sounds. No decreased breath sounds, wheezing, rhonchi or rales.  Chest:     Chest wall: No tenderness.  Musculoskeletal:        General: Normal range of motion.     Cervical back: Normal range of motion and neck supple. Normal range of motion.  Lymphadenopathy:     Cervical: No cervical adenopathy.  Skin:    General: Skin is warm and dry.     Findings: No erythema or rash.  Neurological:     General: No focal deficit present.     Mental Status: He is alert and oriented to person, place, and time.  Psychiatric:        Mood and Affect: Mood normal.        Behavior: Behavior normal.     Visual Acuity Right Eye Distance:   Left Eye Distance:   Bilateral Distance:    Right Eye Near:   Left Eye Near:    Bilateral Near:     UC Couse / Diagnostics / Procedures:     Radiology No results found.  Procedures Procedures (including critical care time) EKG  Pending results:  Labs Reviewed - No data to display  Medications Ordered in UC: Medications  dexamethasone (DECADRON) injection 10 mg (has no administration in time range)    UC Diagnoses / Final Clinical Impressions(s)   I have reviewed the triage vital signs and the nursing notes.  Pertinent labs & imaging results that were available during my care of the patient were reviewed by me and considered in my medical decision making (see chart for details).    Final diagnoses:  Rhinosinusitis  Allergic rhinitis, unspecified seasonality, unspecified trigger   Patient provided with an injection of Decadron to  rapidly relieve his allergy symptoms.  Patient provided with renewals of Zyrtec and Flonase for daily use.  Return precautions advised.  ED Prescriptions     Medication Sig Dispense Auth. Provider   cetirizine (ZYRTEC ALLERGY) 10 MG tablet Take 1 tablet (10 mg total) by mouth at bedtime. 90 tablet Theadora Rama Scales, PA-C   fluticasone (FLONASE) 50 MCG/ACT nasal spray Place 1 spray into both nostrils daily. Begin by using 2 sprays in each nare daily for 3 to 5 days, then decrease to 1 spray in each nare daily. 15.8 mL Theadora Rama Scales, PA-C      PDMP not reviewed this encounter.  Disposition Upon Discharge:  Condition: stable for discharge home Home: take medications as prescribed; routine discharge instructions as discussed; follow up as advised.  Patient presented with an acute illness with associated systemic symptoms and significant discomfort requiring urgent management. In my opinion, this is a condition that a prudent lay person (someone who possesses an average knowledge of health and medicine) may potentially expect to result in complications if not addressed urgently such as respiratory distress, impairment of bodily function or dysfunction of bodily organs.   Routine symptom specific, illness specific and/or disease specific instructions were discussed with the patient and/or caregiver at length.   As such, the patient has been evaluated and assessed, work-up was performed and treatment was provided in alignment with urgent care protocols and evidence based medicine.  Patient/parent/caregiver has been advised that the patient may require follow up for further testing and treatment if the symptoms continue in spite of treatment,  as clinically indicated and appropriate.  If the patient was tested for COVID-19, Influenza and/or RSV, then the patient/parent/guardian was advised to isolate at home pending the results of his/her diagnostic coronavirus test and potentially longer  if they're positive. I have also advised pt that if his/her COVID-19 test returns positive, it's recommended to self-isolate for at least 10 days after symptoms first appeared AND until fever-free for 24 hours without fever reducer AND other symptoms have improved or resolved. Discussed self-isolation recommendations as well as instructions for household member/close contacts as per the [facility] and [place] DHHS, and also gave patient the COVID packet with this information.  Patient/parent/caregiver has been advised to return to the [facility] or PCP in 3-5 days if no better; to PCP or the Emergency Department if new signs and symptoms develop, or if the current signs or symptoms continue to change or worsen for further workup, evaluation and treatment as clinically indicated and appropriate  The patient will follow up with their current PCP if and as advised. If the patient does not currently have a PCP we will assist them in obtaining one.   The patient may need specialty follow up if the symptoms continue, in spite of conservative treatment and management, for further workup, evaluation, consultation and treatment as clinically indicated and appropriate.  Patient/parent/caregiver verbalized understanding and agreement of plan as discussed.  All questions were addressed during visit.  Please see discharge instructions below for further details of plan.  Discharge Instructions:   Discharge Instructions      Your symptoms and my physical exam findings are concerning for exacerbation of your underlying allergies.     Please see the list below for recommended medications, dosages and frequencies to provide relief of current symptoms:     Decadron IM (dexamethasone):  To quickly address your significant respiratory inflammation, you were provided with an injection of Decadron in the office today.  You should continue to feel the full benefit of the steroid for the next 12-24 hours.    Zyrtec (cetirizine): This is  an excellent second-generation antihistamine that helps to reduce respiratory inflammatory response to environmental allergens.  In some patients, this medication can cause daytime sleepiness so I recommend that you take 1 tablet daily at bedtime.     Flonase (fluticasone): This is a steroid nasal spray that you use once daily, 1 spray in each nare.  This medication does not work well if you decide to use it only used as you feel you need to, it works best used on a daily basis.  After 3 to 5 days of use, you will notice significant reduction of the inflammation and mucus production that is currently being caused by exposure to allergens, whether seasonal or environmental.  The most common side effect of this medication is nosebleeds.  If you experience a nosebleed, please discontinue use for 1 week, then feel free to resume.  I have provided you with a prescription.     If your insurance will not cover your allergy medications, please consider downloading the Good Rx app which is free.  You can find considerable discounts on prescription and over-the-counter medications.   If you find that you have not had improvement of your symptoms in the next 3 to 5 days, please follow-up with your primary care provider or return here to urgent care for repeat evaluation and further recommendations.   Thank you for visiting urgent care today.  We appreciate the opportunity to participate in your care.  This office note has been dictated using Teaching laboratory technicianDragon speech recognition software.  Unfortunately, this method of dictation can sometimes lead to typographical or grammatical errors.  I apologize for your inconvenience in advance if this occurs.  Please do not hesitate to reach out to me if clarification is needed.      Theadora RamaMorgan, Sage Kopera Scales, PA-C 02/05/22 1208

## 2022-02-05 NOTE — Discharge Instructions (Signed)
Your symptoms and my physical exam findings are concerning for exacerbation of your underlying allergies.     Please see the list below for recommended medications, dosages and frequencies to provide relief of current symptoms:     Decadron IM (dexamethasone):  To quickly address your significant respiratory inflammation, you were provided with an injection of Decadron in the office today.  You should continue to feel the full benefit of the steroid for the next 12-24 hours.    Zyrtec (cetirizine): This is an excellent second-generation antihistamine that helps to reduce respiratory inflammatory response to environmental allergens.  In some patients, this medication can cause daytime sleepiness so I recommend that you take 1 tablet daily at bedtime.     Flonase (fluticasone): This is a steroid nasal spray that you use once daily, 1 spray in each nare.  This medication does not work well if you decide to use it only used as you feel you need to, it works best used on a daily basis.  After 3 to 5 days of use, you will notice significant reduction of the inflammation and mucus production that is currently being caused by exposure to allergens, whether seasonal or environmental.  The most common side effect of this medication is nosebleeds.  If you experience a nosebleed, please discontinue use for 1 week, then feel free to resume.  I have provided you with a prescription.     If your insurance will not cover your allergy medications, please consider downloading the Good Rx app which is free.  You can find considerable discounts on prescription and over-the-counter medications.   If you find that you have not had improvement of your symptoms in the next 3 to 5 days, please follow-up with your primary care provider or return here to urgent care for repeat evaluation and further recommendations.   Thank you for visiting urgent care today.  We appreciate the opportunity to participate in your care.

## 2022-02-05 NOTE — ED Triage Notes (Signed)
Symptoms started one week ago.  Initially felt like congestion in head.  .  Patient has a cough, blowing congestion from nose.  Points to forehead as an area of pain.  No fever.

## 2022-03-28 ENCOUNTER — Ambulatory Visit: Payer: Commercial Managed Care - HMO | Admitting: Orthopaedic Surgery

## 2022-03-28 ENCOUNTER — Encounter: Payer: Self-pay | Admitting: Orthopaedic Surgery

## 2022-03-28 DIAGNOSIS — M1712 Unilateral primary osteoarthritis, left knee: Secondary | ICD-10-CM | POA: Diagnosis not present

## 2022-03-28 MED ORDER — METHYLPREDNISOLONE ACETATE 40 MG/ML IJ SUSP
80.0000 mg | INTRAMUSCULAR | Status: AC | PRN
  Administered 2022-03-28: 80 mg via INTRA_ARTICULAR

## 2022-03-28 MED ORDER — BUPIVACAINE HCL 0.25 % IJ SOLN
2.0000 mL | INTRAMUSCULAR | Status: AC | PRN
  Administered 2022-03-28: 2 mL via INTRA_ARTICULAR

## 2022-03-28 NOTE — Addendum Note (Signed)
Addended by: Wendi Maya on: 03/28/2022 04:18 PM   Modules accepted: Orders

## 2022-03-28 NOTE — Progress Notes (Signed)
Office Visit Note   Patient: Alex Holt           Date of Birth: Sep 30, 1965           MRN: [id number] Visit Date: 03/28/2022              Requested by: No referring provider defined for this encounter. PCP: Madaline Brilliant, NP (Inactive)   Assessment & Plan: Visit Diagnoses:  1. Unilateral primary osteoarthritis, left knee     Plan: Alex Holt has been seen on several occasions in the past for evaluation of left knee pain.  Films have demonstrated osteoarthritis predominantly in the medial compartment with there is narrowing of the joint line associated with small peripheral osteophytes and subchondral sclerosis.  He has had recurrent effusions that were aspirated and cortisone injected.  He is February 2023 aspirate was cloudy at 162 white cells with 61 neutrophils.  No crystals.  He did well with cortisone injection.  2 months ago he was seen in follow-up for recurrent effusion and repeat aspiration was performed with excellent results with cortisone as well.  He is having recurrent fluid in his knee that limits his ability to be on his feet over the course of the day.  I reaspirate his knee of 45 cc of yellow cloudy fluid we will send to the lab and injected cortisone.  He felt much better  Follow-Up Instructions: Return if symptoms worsen or fail to improve.   Orders:  Orders Placed This Encounter  Procedures   Large Joint Inj   No orders of the defined types were placed in this encounter.     Procedures: Large Joint Inj: L knee on 03/28/2022 3:34 PM Indications: pain and diagnostic evaluation Details: 25 G 1.5 in needle, anteromedial approach  Arthrogram: No  Medications: 80 mg methylPREDNISolone acetate 40 MG/ML; 2 mL bupivacaine 0.25 % Aspirate: 45 mL yellow and cloudy; sent for lab analysis Procedure, treatment alternatives, risks and benefits explained, specific risks discussed. Consent was given by the patient. Patient was prepped and draped in the usual sterile  fashion.       Clinical Data: No additional findings.   Subjective: Chief Complaint  Patient presents with   Left Knee - Pain  Patient presents today for a one month follow up on his left knee. He was here on 01/25/2022 and had his left knee aspirated and injected with cortisone. He said that the injection helped, but it is swollen again. He takes advil as needed for pain relief.   HPI  Review of Systems   Objective: Vital Signs: There were no vitals taken for this visit.  Physical Exam Constitutional:      Appearance: He is well-developed.  Eyes:     Pupils: Pupils are equal, round, and reactive to light.  Pulmonary:     Effort: Pulmonary effort is normal.  Skin:    General: Skin is warm and dry.  Neurological:     Mental Status: He is alert and oriented to person, place, and time.  Psychiatric:        Behavior: Behavior normal.     Ortho Exam left knee with positive effusion.  Had a little bit of medial joint pain.  Full extension flex to about 100 degrees without instability.  Knee was just slightly warm.  No patella pain.  No popliteal pain or calf discomfort.  Painless range of motion of the hip  Specialty Comments:  No specialty comments available.  Imaging: No results found.  PMFS History: Patient Active Problem List   Diagnosis Date Noted   Unilateral primary osteoarthritis, left knee 01/25/2022   Allergic rhinitis 12/23/2013   Flea bite of multiple sites 12/23/2013   Left ankle sprain 08/17/2013   CTS (carpal tunnel syndrome) 04/29/2012   Plantar fasciitis 04/10/2012   Insomnia 11/14/2010   Diabetes mellitus type 2 in obese ([facility]) 11/14/2010   Chronic mental illness 09/25/2010   ANEMIA 03/08/2010   GERD 03/08/2010   HYPERLIPIDEMIA 01/30/2010   OBESITY 01/30/2010   HYPERTENSION 01/30/2010   Past Medical History:  Diagnosis Date   Acid reflux    Diabetes mellitus without complication ([facility])    Hyperlipidemia    Hypertension    Schizophrenia  ([facility])     Family History  Problem Relation Age of Onset   Mental illness Mother    Mental illness Other        FAMILY HISTORY    Diabetes Other     History reviewed. No pertinent surgical history. Social History   Occupational History   Occupation: sELF EMPLOYED     Comment: [employer]   Tobacco Use   Smoking status: Never   Smokeless tobacco: Never  Vaping Use   Vaping Use: Never used  Substance and Sexual Activity   Alcohol use: Yes    Comment: WEEKENDS    Drug use: No   Sexual activity: Yes

## 2022-03-29 LAB — SYNOVIAL FLUID ANALYSIS, COMPLETE
Basophils, %: 0 %
Eosinophils-Synovial: 0 % (ref 0–2)
Lymphocytes-Synovial Fld: 3 % (ref 0–74)
Monocyte/Macrophage: 29 % (ref 0–69)
Neutrophil, Synovial: 68 % — ABNORMAL HIGH (ref 0–24)
Synoviocytes, %: 0 % (ref 0–15)
WBC, Synovial: 12235 cells/uL — ABNORMAL HIGH (ref <150)

## 2022-03-29 NOTE — Progress Notes (Signed)
Please call on Fri and inquire re knee after aspiration and cortisone inj-has elevated WBC in fluid

## 2022-05-07 ENCOUNTER — Encounter: Payer: Self-pay | Admitting: Physician Assistant

## 2022-05-07 ENCOUNTER — Ambulatory Visit (INDEPENDENT_AMBULATORY_CARE_PROVIDER_SITE_OTHER): Payer: Commercial Managed Care - HMO | Admitting: Physician Assistant

## 2022-05-07 DIAGNOSIS — M1712 Unilateral primary osteoarthritis, left knee: Secondary | ICD-10-CM | POA: Diagnosis not present

## 2022-05-07 MED ORDER — LIDOCAINE HCL 1 % IJ SOLN
5.0000 mL | INTRAMUSCULAR | Status: AC | PRN
  Administered 2022-05-07: 5 mL

## 2022-05-07 MED ORDER — BUPIVACAINE HCL 0.25 % IJ SOLN
2.0000 mL | INTRAMUSCULAR | Status: AC | PRN
  Administered 2022-05-07: 2 mL via INTRA_ARTICULAR

## 2022-05-07 MED ORDER — METHYLPREDNISOLONE ACETATE 40 MG/ML IJ SUSP
60.0000 mg | INTRAMUSCULAR | Status: AC | PRN
  Administered 2022-05-07: 60 mg via INTRA_ARTICULAR

## 2022-05-07 NOTE — Progress Notes (Signed)
Office Visit Note   Patient: Alex Holt           Date of Birth: 1965/09/17           MRN: [id number] Visit Date: 05/07/2022              Requested by: No referring provider defined for this encounter. PCP: Madaline Brilliant, NP (Inactive)   Assessment & Plan: Visit Diagnoses:  1. Unilateral primary osteoarthritis, left knee     Plan: Alex Holt is a pleasant 56 year old gentleman with a history of osteoarthritis of his left knee.  He periodically has aspirations and cortisone injections.  His last aspiration was 6 weeks ago with Dr. Cleophas Dunker.  He said he did quite a bit of walking over the Thanksgiving holiday and now has some return of fluid and pain in the knee.  Wondering if he can get it aspirated and have another injection.  I will went ahead and aspirated 35 cc of blood-tinged synovial fluid.  I did inject him with steroid.  He understands that he cannot have any more steroid for another 3 months.  Will follow-up as needed.  He is not interested in surgical discussion right now.  Follow-Up Instructions: Return in about 3 years (around 05/07/2025).   Orders:  No orders of the defined types were placed in this encounter.  No orders of the defined types were placed in this encounter.     Procedures: Large Joint Inj: L knee on 05/07/2022 3:01 PM Indications: pain and diagnostic evaluation Details: 25 G 1.5 in needle, superolateral approach  Arthrogram: No  Medications: 60 mg methylPREDNISolone acetate 40 MG/ML; 5 mL lidocaine 1 %; 2 mL bupivacaine 0.25 % Aspirate: 30 mL blood-tinged Outcome: tolerated well, no immediate complications  After obtaining verbal consent the superior lateral portion of his left knee was prepped with alcohol and Betadine x 2.  He was anesthetized with 3 cc of lidocaine plain.  When adequate analgesia was achieved 18-gauge needle was inserted.  30 cc of blood-tinged synovial fluid was withdrawn without difficulty.  He was injected with 60 cc of  methylprednisolone Band-Aid was applied Procedure, treatment alternatives, risks and benefits explained, specific risks discussed. Consent was given by the patient.     Clinical Data: No additional findings.   Subjective: Chief Complaint  Patient presents with   Left Knee - Edema    HPI patient is a pleasant 56 year old gentleman with known arthritis of his left knee.  Periodically has aspiration and steroid injection.  The last was only 6 weeks ago but he admits he was doing a lot of walking over the Thanksgiving holiday when he was on vacation with his family  Review of Systems  All other systems reviewed and are negative.   Objective: Vital Signs: There were no vitals taken for this visit.  Physical Exam Constitutional:      Appearance: Normal appearance.  Pulmonary:     Effort: Pulmonary effort is normal.  Skin:    General: Skin is warm and dry.  Neurological:     Mental Status: He is alert.   Ortho Exam Left knee no erythema he does have a mild to moderate effusion.  Tender over the medial joint line crepitus with range of motion compartments are soft and nontender.  He is able to sustain a straight leg raise.  Good varus valgus stability he is neurovascular intact Specialty Comments:  No specialty comments available.  Imaging: No results found.   PMFS History: Patient Active  Problem List   Diagnosis Date Noted   Unilateral primary osteoarthritis, left knee 01/25/2022   Allergic rhinitis 12/23/2013   Flea bite of multiple sites 12/23/2013   Left ankle sprain 08/17/2013   CTS (carpal tunnel syndrome) 04/29/2012   Plantar fasciitis 04/10/2012   Insomnia 11/14/2010   Diabetes mellitus type 2 in obese (HCC) 11/14/2010   Chronic mental illness 09/25/2010   ANEMIA 03/08/2010   GERD 03/08/2010   HYPERLIPIDEMIA 01/30/2010   OBESITY 01/30/2010   HYPERTENSION 01/30/2010   Past Medical History:  Diagnosis Date   Acid reflux    Diabetes mellitus without  complication (HCC)    Hyperlipidemia    Hypertension    Schizophrenia (HCC)     Family History  Problem Relation Age of Onset   Mental illness Mother    Mental illness Other        FAMILY HISTORY    Diabetes Other     No past surgical history on file. Social History   Occupational History   Occupation: sELF EMPLOYED     Comment: [employer]   Tobacco Use   Smoking status: Never   Smokeless tobacco: Never  Vaping Use   Vaping Use: Never used  Substance and Sexual Activity   Alcohol use: Yes    Comment: WEEKENDS    Drug use: No   Sexual activity: Yes

## 2022-07-23 DIAGNOSIS — I10 Essential (primary) hypertension: Secondary | ICD-10-CM | POA: Diagnosis not present

## 2022-08-13 ENCOUNTER — Ambulatory Visit (INDEPENDENT_AMBULATORY_CARE_PROVIDER_SITE_OTHER): Payer: 59 | Admitting: Student

## 2022-08-13 DIAGNOSIS — M25561 Pain in right knee: Secondary | ICD-10-CM | POA: Diagnosis not present

## 2022-08-13 DIAGNOSIS — M1712 Unilateral primary osteoarthritis, left knee: Secondary | ICD-10-CM

## 2022-08-13 MED ORDER — LIDOCAINE HCL 1 % IJ SOLN
4.0000 mL | INTRAMUSCULAR | Status: AC | PRN
  Administered 2022-08-13: 4 mL

## 2022-08-13 MED ORDER — TRIAMCINOLONE ACETONIDE 40 MG/ML IJ SUSP
2.0000 mg | INTRAMUSCULAR | Status: AC | PRN
  Administered 2022-08-13: 2 mg via INTRA_ARTICULAR

## 2022-08-13 NOTE — Progress Notes (Signed)
Chief Complaint: Left knee pain     History of Present Illness:    NASSIM KULBACKI is a 57 y.o. male with a history of left knee osteoarthritis.  He is presenting today for evaluation of pain and swelling in his left knee.  He has been seen with our practice for this before, and has received aspiration and cortisone injections.  His last injection was last December with Bevely Palmer.  He states that he has been getting about 3 months of relief.  His activity levels have increased recently as he has been helping take care of his grandkids.  He is looking for some relief in order to be able to be active with them.   Surgical History:   None  PMH/PSH/Family History/Social History/Meds/Allergies:    Past Medical History:  Diagnosis Date   Acid reflux    Diabetes mellitus without complication ([facility])    Hyperlipidemia    Hypertension    Schizophrenia ([place] [facility])    No past surgical history on file. Social History   Socioeconomic History   Marital status: Married    Spouse name: Not on file   Number of children: 2   Years of education: Not on file   Highest education level: Not on file  Occupational History   Occupation: sELF EMPLOYED     Comment: [employer]   Tobacco Use   Smoking status: Never   Smokeless tobacco: Never  Vaping Use   Vaping Use: Never used  Substance and Sexual Activity   Alcohol use: Yes    Comment: WEEKENDS    Drug use: No   Sexual activity: Yes  Other Topics Concern   Not on file  Social History Narrative   Not on file   Social Determinants of Health   Financial Resource Strain: Not on file  Food Insecurity: Not on file  Transportation Needs: Not on file  Physical Activity: Not on file  Stress: Not on file  Social Connections: Not on file   Family History  Problem Relation Age of Onset   Mental illness Mother    Mental illness Other        FAMILY HISTORY    Diabetes Other    No Known  Allergies Current Outpatient Medications  Medication Sig Dispense Refill   amLODipine (NORVASC) 5 MG tablet Take 1 tablet (5 mg total) by mouth daily. 30 tablet 0   atorvastatin (LIPITOR) 20 MG tablet Take 1 tablet (20 mg total) by mouth at bedtime. For cholesterol 30 tablet 6   cetirizine (ZYRTEC ALLERGY) 10 MG tablet Take 1 tablet (10 mg total) by mouth at bedtime. 90 tablet 1   fluticasone (FLONASE) 50 MCG/ACT nasal spray Place 1 spray into both nostrils daily. Begin by using 2 sprays in each nare daily for 3 to 5 days, then decrease to 1 spray in each nare daily. 15.8 mL 2   hydrochlorothiazide (HYDRODIURIL) 25 MG tablet Take 1 tablet (25 mg total) by mouth daily. 30 tablet 3   IRON PO Take 45 mg by mouth.     metFORMIN (GLUCOPHAGE) 500 MG tablet SMARTSIG:1 Tablet(s) By Mouth Morning-Evening     No current facility-administered medications for this visit.   No results found.  Review of Systems:   A ROS was performed including pertinent positives and negatives  as documented in the HPI.  Physical Exam :   Constitutional: NAD and appears stated age Neurological: Alert and oriented Psych: Appropriate affect and cooperative There were no vitals taken for this visit.   Comprehensive Musculoskeletal Exam:    Left knee is without erythema.  Mild tenderness to palpation along medial joint line.  Mild joint effusion present.  Range of motion 0 to 120 degrees.  Crepitus noted with flexion and extension.  Patella mobile and nontender. Stable knee joint with varus and valgus stress.  Distal neurovascular exam intact.  Imaging:     I personally reviewed and interpreted the radiographs.   Assessment:   58 y.o. male presenting for evaluation of left knee pain.  He has known history of osteoarthritis and last received a cortisone injection in December.  He states that the effects have now worn off and is experiencing return of pain and mild swelling due to increased activity.  I recommended  aspiration and injection of his left knee today, and patient is in agreement.  I was able to aspirate 20 cc of blood-tinged synovial fluid and performed cortisone injection without complications.  Patient tolerated procedure well.  Instructed the patient that he can follow-up in clinic as needed, but to hold off on another injection for 3 months.  Plan :    -Return to clinic as needed     Procedure Note  Patient: JARQUEZ MCKNIGHT             Date of Birth: November 22, 1965           MRN: [id number]             Visit Date: 08/13/2022  Procedures: Visit Diagnoses: No diagnosis found.  Large Joint Inj: L knee on 08/13/2022 11:31 AM Indications: pain and joint swelling Details: 18 G 1.5 in needle, anterolateral approach Medications: 2 mg triamcinolone acetonide 40 MG/ML; 4 mL lidocaine 1 % Aspirate: 20 mL blood-tinged Outcome: tolerated well, no immediate complications Consent was given by the patient. Patient was prepped and draped in the usual sterile fashion.       I personally saw and evaluated the patient, and participated in the management and treatment plan.  Marnee Spring, PA-C Orthopedics  This document was dictated using Systems analyst. A reasonable attempt at proof reading has been made to minimize errors.

## 2023-01-16 ENCOUNTER — Ambulatory Visit (INDEPENDENT_AMBULATORY_CARE_PROVIDER_SITE_OTHER): Payer: 59

## 2023-01-16 ENCOUNTER — Ambulatory Visit (INDEPENDENT_AMBULATORY_CARE_PROVIDER_SITE_OTHER): Payer: 59 | Admitting: Student

## 2023-01-16 ENCOUNTER — Other Ambulatory Visit (HOSPITAL_BASED_OUTPATIENT_CLINIC_OR_DEPARTMENT_OTHER): Payer: Self-pay | Admitting: Student

## 2023-01-16 ENCOUNTER — Encounter (HOSPITAL_BASED_OUTPATIENT_CLINIC_OR_DEPARTMENT_OTHER): Payer: Self-pay | Admitting: Student

## 2023-01-16 DIAGNOSIS — M1711 Unilateral primary osteoarthritis, right knee: Secondary | ICD-10-CM | POA: Diagnosis not present

## 2023-01-16 DIAGNOSIS — Q741 Congenital malformation of knee: Secondary | ICD-10-CM

## 2023-01-16 DIAGNOSIS — M25561 Pain in right knee: Secondary | ICD-10-CM | POA: Diagnosis not present

## 2023-01-16 MED ORDER — TRIAMCINOLONE ACETONIDE 40 MG/ML IJ SUSP
2.0000 mL | INTRAMUSCULAR | Status: AC | PRN
  Administered 2023-01-16: 2 mL via INTRA_ARTICULAR

## 2023-01-16 MED ORDER — LIDOCAINE HCL 1 % IJ SOLN
4.0000 mL | INTRAMUSCULAR | Status: AC | PRN
  Administered 2023-01-16: 4 mL

## 2023-01-16 NOTE — Progress Notes (Signed)
Chief Complaint: Right knee pain     History of Present Illness:    Alex Holt is a 57 y.o. male presenting today with right knee pain.  This began within the last 2 weeks without any known injury.  He states that he feels like his right knee has been swelling.  Pain is located in the front of the knee and is mild to moderate in severity.  He reports taking 800 mg of ibuprofen today, which has helped reduce his pain.  Denies any history of problems or surgery to this knee.  He did see me for pain in his left knee about 5 months ago.  There was evidence of osteoarthritis and he states that cortisone injection has helped him significantly.   Surgical History:   None  PMH/PSH/Family History/Social History/Meds/Allergies:    Past Medical History:  Diagnosis Date   Acid reflux    Diabetes mellitus without complication (HCC)    Hyperlipidemia    Hypertension    Schizophrenia (HCC)    History reviewed. No pertinent surgical history. Social History   Socioeconomic History   Marital status: Married    Spouse name: Not on file   Number of children: 2   Years of education: Not on file   Highest education level: Not on file  Occupational History   Occupation: sELF EMPLOYED     Comment: [employer]   Tobacco Use   Smoking status: Never   Smokeless tobacco: Never  Vaping Use   Vaping status: Never Used  Substance and Sexual Activity   Alcohol use: Yes    Comment: WEEKENDS    Drug use: No   Sexual activity: Yes  Other Topics Concern   Not on file  Social History Narrative   Not on file   Social Determinants of Health   Financial Resource Strain: Not on File (09/07/2021)   Received from [facility], [facility]    Financial Resource Strain: 0  Food Insecurity: Not on File (09/07/2021)   Received from [place], [facility] Insecurity    Food: 0  Transportation Needs: Not on File (09/07/2021)   Received from  [facility], [facility] Needs    Transportation: 0  Physical Activity: Not on File (09/07/2021)   Received from [place], [place]   Physical Activity    Physical Activity: 0  Stress: Not on File (09/07/2021)   Received from [facility], [place]   Stress    Stress: 0  Social Connections: Not on File (09/07/2021)   Received from [place], [place]   Social Connections    Social Connections and Isolation: 0   Family History  Problem Relation Age of Onset   Mental illness Mother    Mental illness Other        FAMILY HISTORY    Diabetes Other    No Known Allergies Current Outpatient Medications  Medication Sig Dispense Refill   amLODipine (NORVASC) 5 MG tablet Take 1 tablet (5 mg total) by mouth daily. 30 tablet 0   atorvastatin (LIPITOR) 20 MG tablet Take 1 tablet (20 mg total) by mouth at bedtime. For cholesterol 30 tablet 6   cetirizine (ZYRTEC ALLERGY) 10 MG tablet Take 1 tablet (10 mg total) by mouth at bedtime. 90 tablet 1   fluticasone (FLONASE)  50 MCG/ACT nasal spray Place 1 spray into both nostrils daily. Begin by using 2 sprays in each nare daily for 3 to 5 days, then decrease to 1 spray in each nare daily. 15.8 mL 2   hydrochlorothiazide (HYDRODIURIL) 25 MG tablet Take 1 tablet (25 mg total) by mouth daily. 30 tablet 3   IRON PO Take 45 mg by mouth.     metFORMIN (GLUCOPHAGE) 500 MG tablet SMARTSIG:1 Tablet(s) By Mouth Morning-Evening     No current facility-administered medications for this visit.   No results found.  Review of Systems:   A ROS was performed including pertinent positives and negatives as documented in the HPI.  Physical Exam :   Constitutional: NAD and appears stated age Neurological: Alert and oriented Psych: Appropriate affect and cooperative There were no vitals taken for this visit.   Comprehensive Musculoskeletal Exam:    Right knee appears without any obvious deformity.  Mild swelling noted over the patella.  Tenderness directly over the patella as well  as the medial joint line.  Active range of motion from 5 to 100 degrees without significant crepitus.  No instability with varus or valgus stress.  Negative Lachman.  Imaging:   Xray (right knee 4 views): Bipartite patella.  No evidence of acute fracture or dislocation.  Mild to moderate tricompartmental osteoarthritis, most notable in the medial compartment.    I personally reviewed and interpreted the radiographs.   Assessment:   57 y.o. male with acute right knee pain.  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  His right knee does seem to be bothering him directly over the patella, so there is possibility that this is becoming symptomatic.  He does also have evidence of osteoarthritis, mainly in the medial compartment.  For this reason, I have recommended performing a cortisone injection today as this did give him significant relief in his contralateral knee.  Patient agrees to this, and injection was performed under ultrasound guidance without any complication.  Will continue to monitor his symptoms, and can consider further evaluation of symptomatic bipartite patella should this continue to cause discomfort.  Plan :    -Cortisone injection performed of the right knee today under ultrasound guidance after patient consent -Return to clinic as needed      Procedure Note  Patient: Alex Holt             Date of Birth: 04-28-1966           MRN: [id number]             Visit Date: 01/16/2023  Procedures: Visit Diagnoses:  1. Unilateral primary osteoarthritis, right knee     Large Joint Inj: R knee on 01/16/2023 1:03 PM Indications: pain Details: 22 G 1.5 in needle, ultrasound-guided anterolateral approach Medications: 4 mL lidocaine 1 %; 2 mL triamcinolone acetonide 40 MG/ML Outcome: tolerated well, no immediate complications Procedure, treatment alternatives, risks and benefits explained, specific risks discussed. Consent was given by the patient. Immediately  prior to procedure a time out was called to verify the correct patient, procedure, equipment, support staff and site/side marked as required. Patient was prepped and draped in the usual sterile fashion.      I personally saw and evaluated the patient, and participated in the management and treatment plan.  Hazle Nordmann, PA-C Orthopedics

## 2023-01-25 ENCOUNTER — Ambulatory Visit (INDEPENDENT_AMBULATORY_CARE_PROVIDER_SITE_OTHER): Payer: 59 | Admitting: Student

## 2023-01-25 ENCOUNTER — Encounter (HOSPITAL_BASED_OUTPATIENT_CLINIC_OR_DEPARTMENT_OTHER): Payer: Self-pay | Admitting: Student

## 2023-01-25 DIAGNOSIS — M25462 Effusion, left knee: Secondary | ICD-10-CM

## 2023-01-25 DIAGNOSIS — M1712 Unilateral primary osteoarthritis, left knee: Secondary | ICD-10-CM

## 2023-01-25 MED ORDER — TRIAMCINOLONE ACETONIDE 40 MG/ML IJ SUSP
2.0000 mL | INTRAMUSCULAR | Status: AC | PRN
  Administered 2023-01-25: 2 mL via INTRA_ARTICULAR

## 2023-01-25 MED ORDER — LIDOCAINE HCL 1 % IJ SOLN
4.0000 mL | INTRAMUSCULAR | Status: AC | PRN
  Administered 2023-01-25: 4 mL

## 2023-01-25 NOTE — Progress Notes (Signed)
Chief Complaint: Left knee pain     History of Present Illness:    Alex Holt is a 57 y.o. male presenting today for evaluation of left knee pain.  He does have known left knee osteoarthritis and received an injection on 08/13/2022 which did get him good relief.  Patient states that his knee has begun bothering him again recently.  It is moderately painful he does report swelling.  Also reports that right knee injection performed last week has resolved his symptoms.   Surgical History:   None  PMH/PSH/Family History/Social History/Meds/Allergies:    Past Medical History:  Diagnosis Date   Acid reflux    Diabetes mellitus without complication (HCC)    Hyperlipidemia    Hypertension    Schizophrenia (HCC)    History reviewed. No pertinent surgical history. Social History   Socioeconomic History   Marital status: Married    Spouse name: Not on file   Number of children: 2   Years of education: Not on file   Highest education level: Not on file  Occupational History   Occupation: sELF EMPLOYED     Comment: [employer]   Tobacco Use   Smoking status: Never   Smokeless tobacco: Never  Vaping Use   Vaping status: Never Used  Substance and Sexual Activity   Alcohol use: Yes    Comment: WEEKENDS    Drug use: No   Sexual activity: Yes  Other Topics Concern   Not on file  Social History Narrative   Not on file   Social Determinants of Health   Financial Resource Strain: Not on File (09/07/2021)   Received from [facility], [facility]    Financial Resource Strain: 0  Food Insecurity: Not on file (01/22/2023)  Transportation Needs: Not on File (09/07/2021)   Received from [facility], [employer] Needs    Transportation: 0  Physical Activity: Not on File (09/07/2021)   Received from [place], [place]   Physical Activity    Physical Activity: 0  Stress: Not on File (09/07/2021)   Received from [facility],  [place]   Stress    Stress: 0  Social Connections: Not on File (09/07/2021)   Received from [place], [place]   Social Connections    Social Connections and Isolation: 0   Family History  Problem Relation Age of Onset   Mental illness Mother    Mental illness Other        FAMILY HISTORY    Diabetes Other    No Known Allergies Current Outpatient Medications  Medication Sig Dispense Refill   amLODipine (NORVASC) 5 MG tablet Take 1 tablet (5 mg total) by mouth daily. 30 tablet 0   atorvastatin (LIPITOR) 20 MG tablet Take 1 tablet (20 mg total) by mouth at bedtime. For cholesterol 30 tablet 6   cetirizine (ZYRTEC ALLERGY) 10 MG tablet Take 1 tablet (10 mg total) by mouth at bedtime. 90 tablet 1   fluticasone (FLONASE) 50 MCG/ACT nasal spray Place 1 spray into both nostrils daily. Begin by using 2 sprays in each nare daily for 3 to 5 days, then decrease to 1 spray in each nare daily. 15.8 mL 2   hydrochlorothiazide (HYDRODIURIL) 25 MG tablet Take 1 tablet (25 mg total) by mouth daily. 30 tablet 3  IRON PO Take 45 mg by mouth.     metFORMIN (GLUCOPHAGE) 500 MG tablet SMARTSIG:1 Tablet(s) By Mouth Morning-Evening     No current facility-administered medications for this visit.   No results found.  Review of Systems:   A ROS was performed including pertinent positives and negatives as documented in the HPI.  Physical Exam :   Constitutional: NAD and appears stated age Neurological: Alert and oriented Psych: Appropriate affect and cooperative There were no vitals taken for this visit.   Comprehensive Musculoskeletal Exam:    Left knee active range of motion from 0 to 100 degrees.  Mild to moderate effusion present.  Tenderness over the medial joint line.  No erythema or warmth.  Knee is stable with varus valgus stress.  Negative Lachman.   Imaging:     Assessment:   57 y.o. male with left knee osteoarthritis.  He did respond well to last cortisone injection on 08/13/2022, so I do think  he would be beneficial to repeat again today.  He does have an effusion present which is limiting his right motion, so I did aspirate this in clinic today and was able to draw off 42 mL of clear synovial fluid.  This was followed by a cortisone injection and compression with Ace wrap, and patient tolerated this procedure well.  Will plan to have him return to clinic as needed.   Plan :    -Aspiration and cortisone injection performed of the left knee today after patient consent -Return to clinic as needed      Procedure Note  Patient: Alex Holt             Date of Birth: 17-Oct-1965           MRN: [id number]             Visit Date: 01/25/2023  Procedures: Visit Diagnoses:  1. Unilateral primary osteoarthritis, left knee   2. Effusion, left knee     Large Joint Inj: L knee on 01/25/2023 9:49 AM Indications: pain and joint swelling Details: 18 G 1.5 in needle, superolateral approach Medications: 4 mL lidocaine 1 %; 2 mL triamcinolone acetonide 40 MG/ML Aspirate: 42 mL clear and serous Outcome: tolerated well, no immediate complications Procedure, treatment alternatives, risks and benefits explained, specific risks discussed. Consent was given by the patient. Immediately prior to procedure a time out was called to verify the correct patient, procedure, equipment, support staff and site/side marked as required. Patient was prepped and draped in the usual sterile fashion.      I personally saw and evaluated the patient, and participated in the management and treatment plan.  Hazle Nordmann, PA-C Orthopedics

## 2023-01-28 DIAGNOSIS — I1A0 Resistant hypertension: Secondary | ICD-10-CM | POA: Diagnosis not present

## 2023-01-28 DIAGNOSIS — E119 Type 2 diabetes mellitus without complications: Secondary | ICD-10-CM | POA: Diagnosis not present

## 2023-04-02 ENCOUNTER — Encounter (HOSPITAL_BASED_OUTPATIENT_CLINIC_OR_DEPARTMENT_OTHER): Payer: Self-pay | Admitting: Student

## 2023-04-02 ENCOUNTER — Ambulatory Visit (INDEPENDENT_AMBULATORY_CARE_PROVIDER_SITE_OTHER): Payer: 59 | Admitting: Student

## 2023-04-02 DIAGNOSIS — M25561 Pain in right knee: Secondary | ICD-10-CM | POA: Diagnosis not present

## 2023-04-02 DIAGNOSIS — M1711 Unilateral primary osteoarthritis, right knee: Secondary | ICD-10-CM

## 2023-04-02 NOTE — Progress Notes (Signed)
Chief Complaint: Right knee pain     History of Present Illness:   04/02/23: Patient is here today for follow-up of right knee pain.  He states that this past weekend he was at a college homecoming and tried to participate in a step routine which aggravated his right knee.  Pain has been getting worse since yesterday which she rates now as severe.  Describes pain as sharp.  Has tried taking Advil and using heat with little relief.  Previous cortisone injection on 01/16/2023 did give him good relief up until this point.   01/16/23: Alex Holt is a 57 y.o. male presenting today with right knee pain.  This began within the last 2 weeks without any known injury.  He states that he feels like his right knee has been swelling.  Pain is located in the front of the knee and is mild to moderate in severity.  He reports taking 800 mg of ibuprofen today, which has helped reduce his pain.  Denies any history of problems or surgery to this knee.  He did see me for pain in his left knee about 5 months ago.  There was evidence of osteoarthritis and he states that cortisone injection has helped him significantly.   Surgical History:   None  PMH/PSH/Family History/Social History/Meds/Allergies:    Past Medical History:  Diagnosis Date   Acid reflux    Diabetes mellitus without complication (HCC)    Hyperlipidemia    Hypertension    Schizophrenia (HCC)    History reviewed. No pertinent surgical history. Social History   Socioeconomic History   Marital status: Married    Spouse name: Not on file   Number of children: 2   Years of education: Not on file   Highest education level: Not on file  Occupational History   Occupation: sELF EMPLOYED     Comment: [employer]   Tobacco Use   Smoking status: Never   Smokeless tobacco: Never  Vaping Use   Vaping status: Never Used  Substance and Sexual Activity   Alcohol use: Yes    Comment: WEEKENDS     Drug use: No   Sexual activity: Yes  Other Topics Concern   Not on file  Social History Narrative   Not on file   Social Determinants of Health   Financial Resource Strain: Not on File (09/07/2021)   Received from [facility], [facility]    Financial Resource Strain: 0  Food Insecurity: Not on File (02/14/2023)   Received from [facility] Insecurity    Food: 0  Transportation Needs: Not on File (09/07/2021)   Received from [facility], [facility] Needs    Transportation: 0  Physical Activity: Not on File (09/07/2021)   Received from [place], [place]   Physical Activity    Physical Activity: 0  Stress: Not on File (09/07/2021)   Received from [facility], [place]   Stress    Stress: 0  Social Connections: Not on File (02/11/2023)   Received from [facility]   Social Connections    Connectedness: 0   Family History  Problem Relation Age of Onset   Mental illness Mother    Mental illness Other        FAMILY HISTORY    Diabetes Other  No Known Allergies Current Outpatient Medications  Medication Sig Dispense Refill   amLODipine (NORVASC) 5 MG tablet Take 1 tablet (5 mg total) by mouth daily. 30 tablet 0   atorvastatin (LIPITOR) 20 MG tablet Take 1 tablet (20 mg total) by mouth at bedtime. For cholesterol 30 tablet 6   cetirizine (ZYRTEC ALLERGY) 10 MG tablet Take 1 tablet (10 mg total) by mouth at bedtime. 90 tablet 1   fluticasone (FLONASE) 50 MCG/ACT nasal spray Place 1 spray into both nostrils daily. Begin by using 2 sprays in each nare daily for 3 to 5 days, then decrease to 1 spray in each nare daily. 15.8 mL 2   hydrochlorothiazide (HYDRODIURIL) 25 MG tablet Take 1 tablet (25 mg total) by mouth daily. 30 tablet 3   IRON PO Take 45 mg by mouth.     metFORMIN (GLUCOPHAGE) 500 MG tablet SMARTSIG:1 Tablet(s) By Mouth Morning-Evening     No current facility-administered medications for this visit.   No results found.  Review of Systems:   A ROS was performed  including pertinent positives and negatives as documented in the HPI.  Physical Exam :   Constitutional: NAD and appears stated age Neurological: Alert and oriented Psych: Appropriate affect and cooperative There were no vitals taken for this visit.   Comprehensive Musculoskeletal Exam:    Right knee is tender to palpation over the patella.  No tenderness over bilateral joint lines.  Active range of motion from 20 to 90 degrees.  Mild to moderate suprapatellar effusion present.  No instability with varus or valgus stress.  Imaging:     Assessment:   57 y.o. male with chronic right knee pain.  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  There does seem to be some fluid on the knee today which I expect is a flareup of his arthritis.  Discussed that we are not able to repeat cortisone injections today as last was on 8/28.  Patient is understanding of this but I have also offered aspiration for symptomatic relief.  Patient is agreeable to this plan and I was able to aspirate 20 mL of clear synovial fluid from the knee without difficulty.  This did give him some relief in clinic today and I will plan to see him back on or after December 6 which would allow [place] to repeat injections in 1 or both knees if necessary.  Plan :    -Aspiration performed of the right knee today after patient consent -Return to clinic on 12/6 for follow-up and cortisone injection(s)      Procedure Note  Patient: Alex Holt             Date of Birth: 01-21-1966           MRN: [id number]             Visit Date: 04/02/2023  Procedures: Visit Diagnoses:  1. Unilateral primary osteoarthritis, right knee      Large Joint Inj: R knee on 04/02/2023 5:34 PM Indications: pain and joint swelling Details: 18 G 1.5 in needle, superolateral approach Aspirate: 20 mL clear Outcome: tolerated well, no immediate complications Procedure, treatment alternatives, risks and benefits  explained, specific risks discussed. Consent was given by the patient. Immediately prior to procedure a time out was called to verify the correct patient, procedure, equipment, support staff and site/side marked as required. Patient was prepped and draped in the usual sterile fashion.  I personally saw and evaluated the patient, and participated in the management and treatment plan.  Hazle Nordmann, PA-C Orthopedics

## 2023-04-05 ENCOUNTER — Encounter (HOSPITAL_BASED_OUTPATIENT_CLINIC_OR_DEPARTMENT_OTHER): Payer: Self-pay | Admitting: Student

## 2023-04-05 ENCOUNTER — Ambulatory Visit (INDEPENDENT_AMBULATORY_CARE_PROVIDER_SITE_OTHER): Payer: 59 | Admitting: Student

## 2023-04-05 ENCOUNTER — Telehealth (HOSPITAL_BASED_OUTPATIENT_CLINIC_OR_DEPARTMENT_OTHER): Payer: Self-pay

## 2023-04-05 ENCOUNTER — Telehealth (HOSPITAL_BASED_OUTPATIENT_CLINIC_OR_DEPARTMENT_OTHER): Payer: Self-pay | Admitting: Student

## 2023-04-05 ENCOUNTER — Other Ambulatory Visit (HOSPITAL_BASED_OUTPATIENT_CLINIC_OR_DEPARTMENT_OTHER): Payer: Self-pay

## 2023-04-05 DIAGNOSIS — M1711 Unilateral primary osteoarthritis, right knee: Secondary | ICD-10-CM | POA: Diagnosis not present

## 2023-04-05 MED ORDER — METHYLPREDNISOLONE 4 MG PO TBPK
ORAL_TABLET | ORAL | 0 refills | Status: DC
  Filled 2023-04-05: qty 21, 6d supply, fill #0

## 2023-04-05 NOTE — Telephone Encounter (Signed)
Pt was rescheduled for today for knee injs. It is too soon. He needs to wait until 12/6 when originally scheduled for this. LVM advising pt and advised for a CB

## 2023-04-05 NOTE — Telephone Encounter (Signed)
Please advise 

## 2023-04-05 NOTE — Telephone Encounter (Signed)
I spoke to him.  Will have him come in to assess.

## 2023-04-05 NOTE — Telephone Encounter (Signed)
Patient states he can not walk and what can he do for the swelling in his knee.

## 2023-04-05 NOTE — Progress Notes (Signed)
Chief Complaint: Right knee pain     History of Present Illness:   04/05/23: Alex Holt presents to clinic today for repeat evaluation of his right knee.  I did aspirate this knee 3 days ago and removed 20 mL of fluid.  He states that his knee is swollen back up and has become painful to walk.  He has tried Advil without any significant relief.   04/02/23: Patient is here today for follow-up of right knee pain.  He states that this past weekend he was at a college homecoming and tried to participate in a step routine which aggravated his right knee.  Pain has been getting worse since yesterday which she rates now as severe.  Describes pain as sharp.  Has tried taking Advil and using heat with little relief.  Previous cortisone injection on 01/16/2023 did give him good relief up until this point.   Surgical History:   None  PMH/PSH/Family History/Social History/Meds/Allergies:    Past Medical History:  Diagnosis Date   Acid reflux    Diabetes mellitus without complication (HCC)    Hyperlipidemia    Hypertension    Schizophrenia (HCC)    History reviewed. No pertinent surgical history. Social History   Socioeconomic History   Marital status: Married    Spouse name: Not on file   Number of children: 2   Years of education: Not on file   Highest education level: Not on file  Occupational History   Occupation: sELF EMPLOYED     Comment: [employer]   Tobacco Use   Smoking status: Never   Smokeless tobacco: Never  Vaping Use   Vaping status: Never Used  Substance and Sexual Activity   Alcohol use: Yes    Comment: WEEKENDS    Drug use: No   Sexual activity: Yes  Other Topics Concern   Not on file  Social History Narrative   Not on file   Social Determinants of Health   Financial Resource Strain: Not on File (09/07/2021)   Received from [facility], [facility]    Financial Resource Strain: 0  Food Insecurity:  Not on File (02/14/2023)   Received from [facility] Insecurity    Food: 0  Transportation Needs: Not on File (09/07/2021)   Received from [place], [employer] [facility] Needs    Transportation: 0  Physical Activity: Not on File (09/07/2021)   Received from [place], [place]   Physical Activity    Physical Activity: 0  Stress: Not on File (09/07/2021)   Received from [facility], [place]   Stress    Stress: 0  Social Connections: Not on File (02/11/2023)   Received from [facility]   Social Connections    Connectedness: 0   Family History  Problem Relation Age of Onset   Mental illness Mother    Mental illness Other        FAMILY HISTORY    Diabetes Other    No Known Allergies Current Outpatient Medications  Medication Sig Dispense Refill   methylPREDNISolone (MEDROL DOSEPAK) 4 MG TBPK tablet Take per packet instructions 1 each 0   amLODipine (NORVASC) 5 MG tablet Take 1 tablet (5 mg total) by mouth daily. 30 tablet 0   atorvastatin (LIPITOR) 20 MG tablet Take 1 tablet (20 mg total)  by mouth at bedtime. For cholesterol 30 tablet 6   cetirizine (ZYRTEC ALLERGY) 10 MG tablet Take 1 tablet (10 mg total) by mouth at bedtime. 90 tablet 1   fluticasone (FLONASE) 50 MCG/ACT nasal spray Place 1 spray into both nostrils daily. Begin by using 2 sprays in each nare daily for 3 to 5 days, then decrease to 1 spray in each nare daily. 15.8 mL 2   hydrochlorothiazide (HYDRODIURIL) 25 MG tablet Take 1 tablet (25 mg total) by mouth daily. 30 tablet 3   IRON PO Take 45 mg by mouth.     metFORMIN (GLUCOPHAGE) 500 MG tablet SMARTSIG:1 Tablet(s) By Mouth Morning-Evening     No current facility-administered medications for this visit.   No results found.  Review of Systems:   A ROS was performed including pertinent positives and negatives as documented in the HPI.  Physical Exam :   Constitutional: NAD and appears stated age Neurological: Alert and oriented Psych: Appropriate affect and cooperative There were  no vitals taken for this visit.   Comprehensive Musculoskeletal Exam:    Mild to moderate effusion of the right knee.  Active ROM from 20-100 degrees.  Mild warmth but no erythema present.  Imaging:     Assessment:   57 y.o. male with recurrence of pain and effusion of the right knee.  I did aspirate 20 mL from this knee 3 days ago but it appears to have increased in swelling again.  After discussion of treatment plan we did ultimately decide to repeat aspiration however I was unable to locate any fluid from the superolateral approach.  I will give him a Medrol Dosepak today to see if this helps reduce pain and inflammation.  May need to consider future synovial fluid analysis or MRI to further determine cause of effusions.  He will be eligible for cortisone injection in this right knee on 11/28 and 12/6 for the left.  Plan :    - Return to clinic on 12/6 for cortisone injection    Hazle Nordmann, PA-C Orthopedics

## 2023-04-23 ENCOUNTER — Ambulatory Visit (INDEPENDENT_AMBULATORY_CARE_PROVIDER_SITE_OTHER): Payer: 59 | Admitting: Student

## 2023-04-23 ENCOUNTER — Encounter (HOSPITAL_BASED_OUTPATIENT_CLINIC_OR_DEPARTMENT_OTHER): Payer: Self-pay | Admitting: Student

## 2023-04-23 DIAGNOSIS — M1711 Unilateral primary osteoarthritis, right knee: Secondary | ICD-10-CM | POA: Diagnosis not present

## 2023-04-23 DIAGNOSIS — M25561 Pain in right knee: Secondary | ICD-10-CM | POA: Diagnosis not present

## 2023-04-23 DIAGNOSIS — M25562 Pain in left knee: Secondary | ICD-10-CM | POA: Diagnosis not present

## 2023-04-23 DIAGNOSIS — M1712 Unilateral primary osteoarthritis, left knee: Secondary | ICD-10-CM | POA: Diagnosis not present

## 2023-04-23 MED ORDER — LIDOCAINE HCL 1 % IJ SOLN
4.0000 mL | INTRAMUSCULAR | Status: AC | PRN
  Administered 2023-04-23: 4 mL

## 2023-04-23 MED ORDER — TRIAMCINOLONE ACETONIDE 40 MG/ML IJ SUSP
2.0000 mL | INTRAMUSCULAR | Status: AC | PRN
  Administered 2023-04-23: 2 mL via INTRA_ARTICULAR

## 2023-04-23 NOTE — Progress Notes (Signed)
Chief Complaint: Bilateral knee pain     History of Present Illness:   04/23/23: Alex Holt is in clinic today for evaluation of bilateral knees.  He has been seen periodically by our practice for aspirations and injections of both knees.  He has history of osteoarthritis and bilateral bipartite patellas.  Last injections were performed on 8/28.  His right knee does generally bother him more than the left.   04/02/23: Patient is here today for follow-up of right knee pain.  He states that this past weekend he was at a college homecoming and tried to participate in a step routine which aggravated his right knee.  Pain has been getting worse since yesterday which she rates now as severe.  Describes pain as sharp.  Has tried taking Advil and using heat with little relief.  Previous cortisone injection on 01/16/2023 did give him good relief up until this point.   Surgical History:   None  PMH/PSH/Family History/Social History/Meds/Allergies:    Past Medical History:  Diagnosis Date   Acid reflux    Diabetes mellitus without complication (HCC)    Hyperlipidemia    Hypertension    Schizophrenia (HCC)    History reviewed. No pertinent surgical history. Social History   Socioeconomic History   Marital status: Married    Spouse name: Not on file   Number of children: 2   Years of education: Not on file   Highest education level: Not on file  Occupational History   Occupation: sELF EMPLOYED     Comment: [employer]   Tobacco Use   Smoking status: Never   Smokeless tobacco: Never  Vaping Use   Vaping status: Never Used  Substance and Sexual Activity   Alcohol use: Yes    Comment: WEEKENDS    Drug use: No   Sexual activity: Yes  Other Topics Concern   Not on file  Social History Narrative   Not on file   Social Determinants of Health   Financial Resource Strain: Not on File (09/07/2021)   Received from [facility], [facility]    Financial Resource Strain: 0  Food Insecurity: Not on File (02/14/2023)   Received from [facility] Insecurity    Food: 0  Transportation Needs: Not on File (09/07/2021)   Received from [facility], [employer] Needs    Transportation: 0  Physical Activity: Not on File (09/07/2021)   Received from [place], [place]   Physical Activity    Physical Activity: 0  Stress: Not on File (09/07/2021)   Received from [facility] [place], [place]   Stress    Stress: 0  Social Connections: Not on File (02/11/2023)   Received from [facility]   Social Connections    Connectedness: 0   Family History  Problem Relation Age of Onset   Mental illness Mother    Mental illness Other        FAMILY HISTORY    Diabetes Other    No Known Allergies Current Outpatient Medications  Medication Sig Dispense Refill   amLODipine (NORVASC) 5 MG tablet Take 1 tablet (5 mg total) by mouth daily. 30 tablet 0   atorvastatin (LIPITOR) 20 MG tablet Take 1 tablet (20 mg total) by mouth at bedtime. For cholesterol 30 tablet 6   cetirizine (ZYRTEC  ALLERGY) 10 MG tablet Take 1 tablet (10 mg total) by mouth at bedtime. 90 tablet 1   fluticasone (FLONASE) 50 MCG/ACT nasal spray Place 1 spray into both nostrils daily. Begin by using 2 sprays in each nare daily for 3 to 5 days, then decrease to 1 spray in each nare daily. 15.8 mL 2   hydrochlorothiazide (HYDRODIURIL) 25 MG tablet Take 1 tablet (25 mg total) by mouth daily. 30 tablet 3   IRON PO Take 45 mg by mouth.     metFORMIN (GLUCOPHAGE) 500 MG tablet SMARTSIG:1 Tablet(s) By Mouth Morning-Evening     methylPREDNISolone (MEDROL DOSEPAK) 4 MG TBPK tablet Take per packet instructions 21 each 0   No current facility-administered medications for this visit.   No results found.  Review of Systems:   A ROS was performed including pertinent positives and negatives as documented in the HPI.  Physical Exam :   Constitutional: NAD and appears stated age Neurological: Alert  and oriented Psych: Appropriate affect and cooperative There were no vitals taken for this visit.   Comprehensive Musculoskeletal Exam:    No significant swelling or effusions of bilateral knees.  Tenderness over both patellas.  No overlying erythema or warmth.  Active range of motion from 0 to 110 degrees bilaterally.  Imaging:     Assessment:   57 y.o. male with chronic bilateral knee pain.  He does experience recurrent effusions for which I last saw him on 11/15 however this has resolved.  He is requesting bilateral cortisone injections due to pain in his knees.  I am agreeable to this as it has been just over 3 months since last injections.  Cortisone was placed in both knees using sterile technique without any complication and he tolerated this well.  Will plan to have him return as needed.  Plan :    -Bilateral knee cortisone injections performed today -Return to clinic as needed     Procedure Note  Patient: Alex Holt             Date of Birth: 1966-04-19           MRN: [id number]             Visit Date: 04/23/2023  Procedures: Visit Diagnoses:  1. Unilateral primary osteoarthritis, right knee   2. Unilateral primary osteoarthritis, left knee     Large Joint Inj: bilateral knee on 04/23/2023 6:25 PM Indications: pain Details: 22 G 1.5 in needle, ultrasound-guided anterolateral approach Medications (Right): 4 mL lidocaine 1 %; 2 mL triamcinolone acetonide 40 MG/ML Medications (Left): 4 mL lidocaine 1 %; 2 mL triamcinolone acetonide 40 MG/ML Outcome: tolerated well, no immediate complications Procedure, treatment alternatives, risks and benefits explained, specific risks discussed. Consent was given by the patient. Immediately prior to procedure a time out was called to verify the correct patient, procedure, equipment, support staff and site/side marked as required. Patient was prepped and draped in the usual sterile fashion.       Hazle Nordmann,  PA-C Orthopedics

## 2023-04-24 DIAGNOSIS — Z6834 Body mass index (BMI) 34.0-34.9, adult: Secondary | ICD-10-CM | POA: Diagnosis not present

## 2023-04-24 DIAGNOSIS — E119 Type 2 diabetes mellitus without complications: Secondary | ICD-10-CM | POA: Diagnosis not present

## 2023-04-24 DIAGNOSIS — Z720 Tobacco use: Secondary | ICD-10-CM | POA: Diagnosis not present

## 2023-04-24 DIAGNOSIS — I10 Essential (primary) hypertension: Secondary | ICD-10-CM | POA: Diagnosis not present

## 2023-04-24 DIAGNOSIS — J309 Allergic rhinitis, unspecified: Secondary | ICD-10-CM | POA: Diagnosis not present

## 2023-04-24 DIAGNOSIS — E669 Obesity, unspecified: Secondary | ICD-10-CM | POA: Diagnosis not present

## 2023-04-24 DIAGNOSIS — Z7984 Long term (current) use of oral hypoglycemic drugs: Secondary | ICD-10-CM | POA: Diagnosis not present

## 2023-04-24 DIAGNOSIS — M1990 Unspecified osteoarthritis, unspecified site: Secondary | ICD-10-CM | POA: Diagnosis not present

## 2023-04-24 DIAGNOSIS — E785 Hyperlipidemia, unspecified: Secondary | ICD-10-CM | POA: Diagnosis not present

## 2023-04-26 ENCOUNTER — Ambulatory Visit (HOSPITAL_BASED_OUTPATIENT_CLINIC_OR_DEPARTMENT_OTHER): Payer: 59 | Admitting: Student

## 2023-04-27 NOTE — Progress Notes (Unsigned)
  Cardiology Office Note:  .   Date:  04/29/2023  ID:  Alex Holt, DOB [birth year], MRN [id number] PCP: Madaline Brilliant, NP (Inactive)  [facility] Providers Cardiologist:  None    History of Present Illness: .   Alex Holt is a 57 y.o. male with HTN. 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         Risk Assessment/Calculations:             Physical Exam:   VS:  BP 124/88   Pulse 97   Ht 5\' 7"  (1.702 m)   Wt 239 lb (108.4 kg)   SpO2 95%   BMI 37.43 kg/m    Wt Readings from Last 3 Encounters:  04/29/23 239 lb (108.4 kg)  12/23/13 229 lb (103.9 kg)  09/07/13 232 lb (105.2 kg)    GEN: Well nourished, well developed in no acute distress NECK: No JVD; No carotid bruits CARDIAC: RRR, no murmurs, rubs, gallops RESPIRATORY:  Clear to auscultation without rales, wheezing or rhonchi  ABDOMEN: Soft, non-tender, non-distended EXTREMITIES:  No edema; No deformity   ASSESSMENT AND PLAN: .   Hypertension: Kailas presents for further evaluation of his hypertension.  His blood pressure is now essentially normal after he stopped drinking alcohol.  Continue current medications.  He is on high dose HCTZ.  He is having labs checked by his primary medical doctor today.  I suspect that he will need a potassium supplement.  He he eats a very poor diet consisting of high fat and high salt foods.  I've advised him to work on cutting his salt intake. Processed meats, fast foods.   Advised him to work on starting an exercise program .   He will see [place] on an as-needed basis.         Dispo: PRN   Signed, Kristeen Miss, MD

## 2023-04-29 ENCOUNTER — Ambulatory Visit: Payer: 59 | Attending: Cardiovascular Disease | Admitting: Cardiovascular Disease

## 2023-04-29 ENCOUNTER — Encounter: Payer: Self-pay | Admitting: Cardiovascular Disease

## 2023-04-29 VITALS — BP 124/88 | HR 97 | Ht 67.0 in | Wt 239.0 lb

## 2023-04-29 DIAGNOSIS — I10 Essential (primary) hypertension: Secondary | ICD-10-CM

## 2023-04-29 NOTE — Patient Instructions (Signed)
Medication Instructions:  The current medical regimen is effective;  continue present plan and medications.  *If you need a refill on your cardiac medications before your next appointment, please call your pharmacy*  Follow-Up: At [facility], you and your health needs are our priority.  As part of our continuing mission to provide you with exceptional heart care, we have created designated Provider Care Teams.  These Care Teams include your primary Cardiologist (physician) and Advanced Practice Providers (APPs -  Physician Assistants and Nurse Practitioners) who all work together to provide you with the care you need, when you need it.  We recommend signing up for the patient portal called "MyChart".  Sign up information is provided on this After Visit Summary.  MyChart is used to connect with patients for Virtual Visits (Telemedicine).  Patients are able to view lab/test results, encounter notes, upcoming appointments, etc.  Non-urgent messages can be sent to your provider as well.   To learn more about what you can do with MyChart, go to [email or web address].    Your next appointment:   Follow up as needed.  

## 2023-05-06 ENCOUNTER — Other Ambulatory Visit: Payer: 59

## 2023-05-06 ENCOUNTER — Ambulatory Visit
Admission: EM | Admit: 2023-05-06 | Discharge: 2023-05-06 | Disposition: A | Discharge status: Home / Self Care | Payer: 59 | Attending: Family Medicine | Admitting: Family Medicine

## 2023-05-06 ENCOUNTER — Ambulatory Visit (INDEPENDENT_AMBULATORY_CARE_PROVIDER_SITE_OTHER): Payer: 59

## 2023-05-06 DIAGNOSIS — L02811 Cutaneous abscess of head [any part, except face]: Secondary | ICD-10-CM | POA: Diagnosis not present

## 2023-05-06 DIAGNOSIS — M79644 Pain in right finger(s): Secondary | ICD-10-CM

## 2023-05-06 DIAGNOSIS — W540XXA Bitten by dog, initial encounter: Secondary | ICD-10-CM | POA: Diagnosis not present

## 2023-05-06 DIAGNOSIS — M19041 Primary osteoarthritis, right hand: Secondary | ICD-10-CM | POA: Diagnosis not present

## 2023-05-06 MED ORDER — DOXYCYCLINE HYCLATE 100 MG PO CAPS: 100.0000 mg | ORAL_CAPSULE | Freq: Two times a day (BID) | ORAL | 0 refills | Status: DC

## 2023-05-06 NOTE — ED Provider Notes (Signed)
[facility]  Note:  This document was prepared using Conservation officer, historic buildings and may include unintentional dictation errors.  MRN: [id number] DOB: [birth year]  Subjective:   Alex Holt is a 57 y.o. male presenting for 2 chief complaints.  Reports 1 week history of persistent painful bump over the back of the head along the scalp.  Has tried multiple measures including cortisone, alcohol, peroxide.  No drainage of pus or bleeding. Suffered a dog bite to the right thumb 1 week ago.  Reports that this is their own personal dog.  Stays indoors.  Vaccine for rabies is out of date by just over a year.    No current facility-administered medications for this encounter.  Current Outpatient Medications:    metFORMIN (GLUCOPHAGE) 500 MG tablet, Take by mouth 2 (two) times daily with a meal., Disp: , Rfl:    amLODipine (NORVASC) 10 MG tablet, Take 1 tablet by mouth daily., Disp: , Rfl:    atorvastatin (LIPITOR) 20 MG tablet, Take 1 tablet (20 mg total) by mouth at bedtime. For cholesterol, Disp: 30 tablet, Rfl: 6   cetirizine (ZYRTEC ALLERGY) 10 MG tablet, Take 1 tablet (10 mg total) by mouth at bedtime., Disp: 90 tablet, Rfl: 1   fluticasone (FLONASE) 50 MCG/ACT nasal spray, Place 1 spray into both nostrils daily. Begin by using 2 sprays in each nare daily for 3 to 5 days, then decrease to 1 spray in each nare daily., Disp: 15.8 mL, Rfl: 2   hydrochlorothiazide (HYDRODIURIL) 50 MG tablet, Take 50 mg by mouth daily., Disp: , Rfl:    IRON PO, Take 45 mg by mouth., Disp: , Rfl:    loratadine (CLARITIN) 10 MG tablet, Take 10 mg by mouth daily as needed for allergies., Disp: , Rfl:    metFORMIN (GLUCOPHAGE) 500 MG tablet, SMARTSIG:1 Tablet(s) By Mouth Morning-Evening, Disp: , Rfl:    methylPREDNISolone (MEDROL DOSEPAK) 4 MG TBPK tablet, Take per packet instructions, Disp: 21 each, Rfl: 0   triamcinolone cream (KENALOG) 0.5 %, Apply 1 Application topically as needed  (pain)., Disp: , Rfl:    valsartan (DIOVAN) 80 MG tablet, Take 80 mg by mouth daily., Disp: , Rfl:    No Known Allergies  Past Medical History:  Diagnosis Date   Acid reflux    Diabetes mellitus without complication (HCC)    Hyperlipidemia    Hypertension    Schizophrenia (HCC)      History reviewed. No pertinent surgical history.  Family History  Problem Relation Age of Onset   Mental illness Mother    Mental illness Other        FAMILY HISTORY    Diabetes Other     Social History   Tobacco Use   Smoking status: Never   Smokeless tobacco: Never  Vaping Use   Vaping status: Never Used  Substance Use Topics   Alcohol use: Not Currently    Comment: Stopped drinked in Sept '24   Drug use: No    ROS   Objective:   Vitals: BP (!) 188/107 (BP Location: Right Arm)   Pulse 92   Temp 98.7 F (37.1 C) (Oral)   Resp 18   SpO2 97%   Physical Exam Constitutional:      General: He is not in acute distress.    Appearance: Normal appearance. He is well-developed and normal weight. He is not ill-appearing, toxic-appearing or diaphoretic.  HENT:     Head: Normocephalic and atraumatic.  Right Ear: External ear normal.     Left Ear: External ear normal.     Nose: Nose normal.     Mouth/Throat:     Pharynx: Oropharynx is clear.  Eyes:     General: No scleral icterus.       Right eye: No discharge.        Left eye: No discharge.     Extraocular Movements: Extraocular movements intact.  Cardiovascular:     Rate and Rhythm: Normal rate.  Pulmonary:     Effort: Pulmonary effort is normal.  Musculoskeletal:       Hands:     Cervical back: Normal range of motion.  Neurological:     Mental Status: He is alert and oriented to person, place, and time.  Psychiatric:        Mood and Affect: Mood normal.        Behavior: Behavior normal.        Thought Content: Thought content normal.        Judgment: Judgment normal.     DG Finger Thumb Right Result Date:  05/06/2023 CLINICAL DATA:  Dog bite.  Pain EXAM: RIGHT THUMB 3V COMPARISON:  None Available. FINDINGS: Mild degenerative changes of the first metacarpophalangeal joint with some sclerosis and joint space loss. No acute fracture or dislocation. Preserved bone mineralization. Well corticated density seen adjacent to the distal aspect of the of the distal phalanx of the thumb. Please correlate for sequela of remote trauma or other chronic process. IMPRESSION: No fracture or dislocation. Mild degenerative changes and other chronic changes. No radiopaque foreign body. Electronically Signed   By: Karen Kays M.D.   On: 05/06/2023 13:55    Assessment and Plan :   PDMP not reviewed this encounter.  1. Scalp abscess   2. Pain of right thumb   3. Dog bite, initial encounter    Successful I&D performed.  Wound care reviewed.  Start doxycycline for the abscess, Tylenol for pain.low suspicion for rabies.  Wound is well-appearing, no signs of infection.  Counseled patient on potential for adverse effects with medications prescribed/recommended today, ER and return-to-clinic precautions discussed, patient verbalized understanding.    Wallis Bamberg, [place] 05/06/23 1610

## 2023-05-06 NOTE — ED Triage Notes (Signed)
Pt reports he has a painful bump in the neck x 1 week.   Pt reports he ws bite by his dog 1 week ago in the right thumb. Pt not sure when was the last rabies vaccine.

## 2023-05-06 NOTE — Discharge Instructions (Signed)
Start doxycycline for the infection. Use Tylenol for the pain. Please change your dressing 3-5 times daily. Do not apply any ointments or creams. Each time you change your dressing, make sure that you are pressing on the wound to get pus to come out.  Try your best to clean the wound with antibacterial soap and warm water. Pat your wound dry and let it air out if possible to make sure it is dry before reapplying another dressing.   I will call with your x-ray results later today.

## 2023-06-12 ENCOUNTER — Other Ambulatory Visit (HOSPITAL_COMMUNITY): Payer: Self-pay

## 2023-06-18 DIAGNOSIS — Z23 Encounter for immunization: Secondary | ICD-10-CM | POA: Diagnosis not present

## 2023-06-18 DIAGNOSIS — I10 Essential (primary) hypertension: Secondary | ICD-10-CM | POA: Diagnosis not present

## 2023-06-18 DIAGNOSIS — Z1211 Encounter for screening for malignant neoplasm of colon: Secondary | ICD-10-CM | POA: Diagnosis not present

## 2023-06-18 DIAGNOSIS — E782 Mixed hyperlipidemia: Secondary | ICD-10-CM | POA: Diagnosis not present

## 2023-06-18 DIAGNOSIS — Z0001 Encounter for general adult medical examination with abnormal findings: Secondary | ICD-10-CM | POA: Diagnosis not present

## 2023-06-18 DIAGNOSIS — E669 Obesity, unspecified: Secondary | ICD-10-CM | POA: Diagnosis not present

## 2023-06-18 DIAGNOSIS — Z1212 Encounter for screening for malignant neoplasm of rectum: Secondary | ICD-10-CM | POA: Diagnosis not present

## 2023-06-18 DIAGNOSIS — Z125 Encounter for screening for malignant neoplasm of prostate: Secondary | ICD-10-CM | POA: Diagnosis not present

## 2023-06-18 DIAGNOSIS — E1169 Type 2 diabetes mellitus with other specified complication: Secondary | ICD-10-CM | POA: Diagnosis not present

## 2023-06-24 ENCOUNTER — Ambulatory Visit (HOSPITAL_BASED_OUTPATIENT_CLINIC_OR_DEPARTMENT_OTHER): Payer: 59 | Admitting: Student

## 2023-07-01 DIAGNOSIS — Z0120 Encounter for dental examination and cleaning without abnormal findings: Secondary | ICD-10-CM | POA: Diagnosis not present

## 2023-07-04 ENCOUNTER — Encounter (HOSPITAL_BASED_OUTPATIENT_CLINIC_OR_DEPARTMENT_OTHER): Payer: Self-pay | Admitting: Student

## 2023-07-04 ENCOUNTER — Ambulatory Visit (INDEPENDENT_AMBULATORY_CARE_PROVIDER_SITE_OTHER): Payer: 59 | Admitting: Student

## 2023-07-04 DIAGNOSIS — M1712 Unilateral primary osteoarthritis, left knee: Secondary | ICD-10-CM | POA: Diagnosis not present

## 2023-07-04 DIAGNOSIS — M25462 Effusion, left knee: Secondary | ICD-10-CM | POA: Diagnosis not present

## 2023-07-04 NOTE — Progress Notes (Signed)
Chief Complaint: Bilateral knee pain     History of Present Illness:   07/04/23: Patient returns to clinic today for further evaluation of pain in bilateral knees.  He was last seen in clinic on 12/3 for cortisone injections which have given him relief.  He does continue to have notable swelling of the left knee and has been seen multiple times over the past 3 years for aspirations.  Denies taking any pain medications.   04/23/23: Alex Holt is in clinic today for evaluation of bilateral knees.  He has been seen periodically by our practice for aspirations and injections of both knees.  He has history of osteoarthritis and bilateral bipartite patellas.  Last injections were performed on 8/28.  His right knee does generally bother him more than the left.  Surgical History:   None  PMH/PSH/Family History/Social History/Meds/Allergies:    Past Medical History:  Diagnosis Date   Acid reflux    Diabetes mellitus without complication (HCC)    Hyperlipidemia    Hypertension    Schizophrenia (HCC)    History reviewed. No pertinent surgical history. Social History   Socioeconomic History   Marital status: Married    Spouse name: Not on file   Number of children: 2   Years of education: Not on file   Highest education level: Not on file  Occupational History   Occupation: sELF EMPLOYED     Comment: [employer]   Tobacco Use   Smoking status: Never   Smokeless tobacco: Never  Vaping Use   Vaping status: Never Used  Substance and Sexual Activity   Alcohol use: Not Currently    Comment: Stopped drinked in Sept '24   Drug use: No   Sexual activity: Yes  Other Topics Concern   Not on file  Social History Narrative   Not on file   Social Drivers of Health   Financial Resource Strain: Not on File (09/07/2021)   Received from [facility], [facility]    Financial Resource Strain: 0  Food Insecurity: Not at Risk  (06/18/2023)   Received from [facility] Insecurity    Food: 1  Transportation Needs: Not at Risk (06/18/2023)   Received from [facility] Needs    Transportation: 1  Physical Activity: Not on File (09/07/2021)   Received from [place], [place]   Physical Activity    Physical Activity: 0  Stress: Not on File (09/07/2021)   Received from [facility], [place]   Stress    Stress: 0  Social Connections: Not on File (02/11/2023)   Received from [facility]   Social Connections    Connectedness: 0   Family History  Problem Relation Age of Onset   Mental illness Mother    Mental illness Other        FAMILY HISTORY    Diabetes Other    No Known Allergies Current Outpatient Medications  Medication Sig Dispense Refill   amLODipine (NORVASC) 10 MG tablet Take 1 tablet by mouth daily.     atorvastatin (LIPITOR) 20 MG tablet Take 1 tablet (20 mg total) by mouth at bedtime. For cholesterol 30 tablet 6   cetirizine (ZYRTEC ALLERGY) 10 MG tablet Take 1 tablet (10 mg total) by mouth at bedtime. 90 tablet 1   doxycycline (VIBRAMYCIN) 100  MG capsule Take 1 capsule (100 mg total) by mouth 2 (two) times daily. 20 capsule 0   fluticasone (FLONASE) 50 MCG/ACT nasal spray Place 1 spray into both nostrils daily. Begin by using 2 sprays in each nare daily for 3 to 5 days, then decrease to 1 spray in each nare daily. 15.8 mL 2   hydrochlorothiazide (HYDRODIURIL) 50 MG tablet Take 50 mg by mouth daily.     IRON PO Take 45 mg by mouth.     loratadine (CLARITIN) 10 MG tablet Take 10 mg by mouth daily as needed for allergies.     metFORMIN (GLUCOPHAGE) 500 MG tablet SMARTSIG:1 Tablet(s) By Mouth Morning-Evening     metFORMIN (GLUCOPHAGE) 500 MG tablet Take by mouth 2 (two) times daily with a meal.     methylPREDNISolone (MEDROL DOSEPAK) 4 MG TBPK tablet Take per packet instructions 21 each 0   triamcinolone cream (KENALOG) 0.5 % Apply 1 Application topically as needed (pain).     valsartan (DIOVAN) 80 MG tablet Take  80 mg by mouth daily.     No current facility-administered medications for this visit.   No results found.  Review of Systems:   A ROS was performed including pertinent positives and negatives as documented in the HPI.  Physical Exam :   Constitutional: NAD and appears stated age Neurological: Alert and oriented Psych: Appropriate affect and cooperative There were no vitals taken for this visit.   Comprehensive Musculoskeletal Exam:    Bilateral knee active range of motion from 0 to 110 degrees with mild palpable crepitus.  No laxity with varus or valgus stress bilaterally.  Tenderness with palpation around bilateral patellas.  Left knee does have presence of a mild effusion without overlying warmth.  Imaging:    Assessment:   58 y.o. male with chronic pain in bilateral knees.  He does have evidence of mild to moderate osteoarthritis particular in the medial compartments as well as bilateral multipartite patellas.  He has been seen by our practice now multiple occasions over the past 3 years for aspirations and/or cortisone injections of both knees however symptoms have been returning after approximately 3 months.  At this point I would like to obtain an MRI for further investigation of continuing pain and particularly of recurrent effusions of the left knee.  I will plan to order MRI of the left knee today and will have him follow-up after review at which time he will likely be eligible for repeat injections if indicated.  Plan :    -Obtain left knee MRI and follow up in clinic for review and treatment discussion     Hazle Nordmann, PA-C Orthopedics

## 2023-07-19 ENCOUNTER — Encounter (HOSPITAL_BASED_OUTPATIENT_CLINIC_OR_DEPARTMENT_OTHER): Payer: Self-pay | Admitting: Student

## 2023-07-22 ENCOUNTER — Ambulatory Visit
Admission: RE | Admit: 2023-07-22 | Discharge: 2023-07-22 | Disposition: A | Discharge status: Home / Self Care | Payer: 59 | Source: Ambulatory Visit | Attending: Student | Admitting: Student

## 2023-07-22 DIAGNOSIS — M1712 Unilateral primary osteoarthritis, left knee: Secondary | ICD-10-CM

## 2023-07-22 DIAGNOSIS — M25462 Effusion, left knee: Secondary | ICD-10-CM

## 2023-07-22 DIAGNOSIS — M23332 Other meniscus derangements, other medial meniscus, left knee: Secondary | ICD-10-CM | POA: Diagnosis not present

## 2023-07-22 DIAGNOSIS — M23342 Other meniscus derangements, anterior horn of lateral meniscus, left knee: Secondary | ICD-10-CM | POA: Diagnosis not present

## 2023-07-22 DIAGNOSIS — M25562 Pain in left knee: Secondary | ICD-10-CM | POA: Diagnosis not present

## 2023-08-08 ENCOUNTER — Ambulatory Visit (HOSPITAL_BASED_OUTPATIENT_CLINIC_OR_DEPARTMENT_OTHER): Admitting: Student

## 2023-08-12 ENCOUNTER — Ambulatory Visit (HOSPITAL_BASED_OUTPATIENT_CLINIC_OR_DEPARTMENT_OTHER): Admitting: Student

## 2023-08-20 ENCOUNTER — Ambulatory Visit (HOSPITAL_BASED_OUTPATIENT_CLINIC_OR_DEPARTMENT_OTHER): Admitting: Student

## 2023-08-20 ENCOUNTER — Encounter (HOSPITAL_BASED_OUTPATIENT_CLINIC_OR_DEPARTMENT_OTHER): Payer: Self-pay | Admitting: Student

## 2023-08-20 DIAGNOSIS — M23304 Other meniscus derangements, unspecified medial meniscus, left knee: Secondary | ICD-10-CM | POA: Diagnosis not present

## 2023-08-20 DIAGNOSIS — M1712 Unilateral primary osteoarthritis, left knee: Secondary | ICD-10-CM

## 2023-08-20 MED ORDER — TRIAMCINOLONE ACETONIDE 40 MG/ML IJ SUSP
2.0000 mL | INTRAMUSCULAR | Status: AC | PRN
  Administered 2023-08-20: 2 mL via INTRA_ARTICULAR

## 2023-08-20 MED ORDER — LIDOCAINE HCL 1 % IJ SOLN
4.0000 mL | INTRAMUSCULAR | Status: AC | PRN
  Administered 2023-08-20: 4 mL

## 2023-08-20 NOTE — Progress Notes (Signed)
 Chief Complaint: Bilateral knee pain     History of Present Illness:   08/20/23: Patient presents today for MRI follow-up of the left knee.  Overall he reports little changes in his symptoms.  He does note some swelling today of the left knee.  Last cortisone injection on 04/23/2023 provided approximately 3 months of relief.   07/04/23: Patient returns to clinic today for further evaluation of pain in bilateral knees.  He was last seen in clinic on 12/3 for cortisone injections which have given him relief.  He does continue to have notable swelling of the left knee and has been seen multiple times over the past 3 years for aspirations.  Denies taking any pain medications.  Surgical History:   None  PMH/PSH/Family History/Social History/Meds/Allergies:    Past Medical History:  Diagnosis Date   Acid reflux    Diabetes mellitus without complication (HCC)    Hyperlipidemia    Hypertension    Schizophrenia (HCC)    History reviewed. No pertinent surgical history. Social History   Socioeconomic History   Marital status: Married    Spouse name: Not on file   Number of children: 2   Years of education: Not on file   Highest education level: Not on file  Occupational History   Occupation: sELF EMPLOYED     Comment: [employer]   Tobacco Use   Smoking status: Never   Smokeless tobacco: Never  Vaping Use   Vaping status: Never Used  Substance and Sexual Activity   Alcohol use: Not Currently    Comment: Stopped drinked in Sept '24   Drug use: No   Sexual activity: Yes  Other Topics Concern   Not on file  Social History Narrative   Not on file   Social Drivers of Health   Financial Resource Strain: Not on File (09/07/2021)   Received from [facility], [facility]    Financial Resource Strain: 0  Food Insecurity: Not at Risk (06/18/2023)   Received from [facility] Insecurity    Food: 1  Transportation  Needs: Not at Risk (06/18/2023)   Received from [facility] Needs    Transportation: 1  Physical Activity: Not on File (09/07/2021)   Received from [place], [place]   Physical Activity    Physical Activity: 0  Stress: Not on File (09/07/2021)   Received from [facility], [place]   Stress    Stress: 0  Social Connections: Not on File (02/11/2023)   Received from [facility]   Social Connections    Connectedness: 0   Family History  Problem Relation Age of Onset   Mental illness Mother    Mental illness Other        FAMILY HISTORY    Diabetes Other    No Known Allergies Current Outpatient Medications  Medication Sig Dispense Refill   amLODipine (NORVASC) 10 MG tablet Take 1 tablet by mouth daily.     atorvastatin (LIPITOR) 20 MG tablet Take 1 tablet (20 mg total) by mouth at bedtime. For cholesterol 30 tablet 6   cetirizine (ZYRTEC ALLERGY) 10 MG tablet Take 1 tablet (10 mg total) by mouth at bedtime. 90 tablet 1   doxycycline (VIBRAMYCIN) 100 MG capsule Take 1 capsule (100 mg total) by mouth 2 (two) times  daily. 20 capsule 0   fluticasone (FLONASE) 50 MCG/ACT nasal spray Place 1 spray into both nostrils daily. Begin by using 2 sprays in each nare daily for 3 to 5 days, then decrease to 1 spray in each nare daily. 15.8 mL 2   hydrochlorothiazide (HYDRODIURIL) 50 MG tablet Take 50 mg by mouth daily.     IRON PO Take 45 mg by mouth.     loratadine (CLARITIN) 10 MG tablet Take 10 mg by mouth daily as needed for allergies.     metFORMIN (GLUCOPHAGE) 500 MG tablet SMARTSIG:1 Tablet(s) By Mouth Morning-Evening     metFORMIN (GLUCOPHAGE) 500 MG tablet Take by mouth 2 (two) times daily with a meal.     methylPREDNISolone (MEDROL DOSEPAK) 4 MG TBPK tablet Take per packet instructions 21 each 0   triamcinolone cream (KENALOG) 0.5 % Apply 1 Application topically as needed (pain).     valsartan (DIOVAN) 80 MG tablet Take 80 mg by mouth daily.     No current facility-administered medications for this  visit.   No results found.  Review of Systems:   A ROS was performed including pertinent positives and negatives as documented in the HPI.  Physical Exam :   Constitutional: NAD and appears stated age Neurological: Alert and oriented Psych: Appropriate affect and cooperative There were no vitals taken for this visit.   Comprehensive Musculoskeletal Exam:    Left knee exam demonstrates tenderness to palpation over the medial joint line.  Mild to moderate suprapatellar effusion present.  Active knee ROM from 0 to 110 degrees.  No instability with varus and valgus rest.  No overlying erythema or warmth.  Imaging:   MRI left knee: Significant cartilage loss within the medial compartment with degeneration of the medial meniscus.  Tear involving the anterior lateral meniscus.  Joint effusion present.   I personally reviewed and interpreted the radiographs.  Assessment:   58 y.o. male with chronic pain and history of recurrent effusions of the left knee.  MRI does show significant degenerative changes particular within the medial compartment, although there does also appear to be a small lateral meniscus tear.  He has been managing this with cortisone injections which have been providing about 3 months of relief.  I have offered to repeat this today and was able to aspirate 18 mL of clear synovial fluid followed by cortisone injection which he tolerated well.  Discussed that given the findings in his knee on MRI, he may likely be a candidate down the road for a partial or total knee replacement.  Plan :    -Left knee cortisone injection and aspiration performed today -Follow-up as needed    Procedure Note  Patient: Alex Holt             Date of Birth: 04-Aug-1965           MRN: [id number]             Visit Date: 08/20/2023  Procedures: Visit Diagnoses:  1. Unilateral primary osteoarthritis, left knee   2. Derangement of medial meniscus, left     Large Joint Inj: L knee on  08/20/2023 12:34 PM Indications: pain and joint swelling Details: 18 G 1.5 in needle, superolateral approach Medications: 4 mL lidocaine 1 %; 2 mL triamcinolone acetonide 40 MG/ML Aspirate: 18 mL clear Outcome: tolerated well, no immediate complications Procedure, treatment alternatives, risks and benefits explained, specific risks discussed. Consent was given by the patient. Immediately prior to procedure a time out  was called to verify the correct patient, procedure, equipment, support staff and site/side marked as required. Patient was prepped and draped in the usual sterile fashion.        Hazle Nordmann, PA-C Orthopedics

## 2023-08-27 DIAGNOSIS — E66812 Obesity, class 2: Secondary | ICD-10-CM | POA: Diagnosis not present

## 2023-08-27 DIAGNOSIS — W57XXXA Bitten or stung by nonvenomous insect and other nonvenomous arthropods, initial encounter: Secondary | ICD-10-CM | POA: Diagnosis not present

## 2023-08-27 DIAGNOSIS — Z6837 Body mass index (BMI) 37.0-37.9, adult: Secondary | ICD-10-CM | POA: Diagnosis not present

## 2023-08-27 DIAGNOSIS — S20462A Insect bite (nonvenomous) of left back wall of thorax, initial encounter: Secondary | ICD-10-CM | POA: Diagnosis not present

## 2023-09-26 ENCOUNTER — Ambulatory Visit (HOSPITAL_BASED_OUTPATIENT_CLINIC_OR_DEPARTMENT_OTHER): Admitting: Student

## 2023-09-26 ENCOUNTER — Encounter (HOSPITAL_BASED_OUTPATIENT_CLINIC_OR_DEPARTMENT_OTHER): Payer: Self-pay | Admitting: Student

## 2023-09-26 ENCOUNTER — Other Ambulatory Visit (HOSPITAL_BASED_OUTPATIENT_CLINIC_OR_DEPARTMENT_OTHER): Payer: Self-pay

## 2023-09-26 DIAGNOSIS — M1712 Unilateral primary osteoarthritis, left knee: Secondary | ICD-10-CM

## 2023-09-26 MED ORDER — MELOXICAM 15 MG PO TABS
15.0000 mg | ORAL_TABLET | Freq: Every day | ORAL | 0 refills | Status: DC
  Filled 2023-09-26: qty 14, 14d supply, fill #0

## 2023-09-26 NOTE — Progress Notes (Signed)
 Chief Complaint: Bilateral knee pain     History of Present Illness:   09/26/23: Patient presents today for follow-up, mainly concerning the left knee.  His right knee was last injected on 04/23/2023 and is not causing him any pain at this time.  Last left knee cortisone injection was performed on 08/20/2023 and has recently been swelling and becoming more bothersome.  Prior injections have given about 3 months of relief.  He did have a recent MRI on the left knee in March which showed maceration of the medial meniscus, full-thickness medial compartment cartilage loss, and moderate lateral compartment cartilage loss.   08/20/23: Patient presents today for MRI follow-up of the left knee.  Overall he reports little changes in his symptoms.  He does note some swelling today of the left knee.  Last cortisone injection on 04/23/2023 provided approximately 3 months of relief.  Surgical History:   None  PMH/PSH/Family History/Social History/Meds/Allergies:    Past Medical History:  Diagnosis Date   Acid reflux    Diabetes mellitus without complication (HCC)    Hyperlipidemia    Hypertension    Schizophrenia (HCC)    History reviewed. No pertinent surgical history. Social History   Socioeconomic History   Marital status: Married    Spouse name: Not on file   Number of children: 2   Years of education: Not on file   Highest education level: Not on file  Occupational History   Occupation: sELF EMPLOYED     Comment: [employer]   Tobacco Use   Smoking status: Never   Smokeless tobacco: Never  Vaping Use   Vaping status: Never Used  Substance and Sexual Activity   Alcohol use: Not Currently    Comment: Stopped drinked in Sept '24   Drug use: No   Sexual activity: Yes  Other Topics Concern   Not on file  Social History Narrative   Not on file   Social Drivers of Health   Financial Resource Strain: Not on File (09/07/2021)   Received  from [facility], [facility]    Financial Resource Strain: 0  Food Insecurity: Not at Risk (06/18/2023)   Received from [facility] Insecurity    Food: 1  Transportation Needs: Not at Risk (06/18/2023)   Received from [facility] Needs    Transportation: 1  Physical Activity: Not on File (09/07/2021)   Received from [place], [place]   Physical Activity    Physical Activity: 0  Stress: Not on File (09/07/2021)   Received from [facility], [place]   Stress    Stress: 0  Social Connections: Not on File (02/11/2023)   Received from [facility]   Social Connections    Connectedness: 0   Family History  Problem Relation Age of Onset   Mental illness Mother    Mental illness Other        FAMILY HISTORY    Diabetes Other    No Known Allergies Current Outpatient Medications  Medication Sig Dispense Refill   meloxicam (MOBIC) 15 MG tablet Take 1 tablet (15 mg total) by mouth daily. 14 tablet 0   amLODipine  (NORVASC ) 10 MG tablet Take 1 tablet by mouth daily.     atorvastatin  (LIPITOR) 20 MG tablet Take 1 tablet (20 mg total) by  mouth at bedtime. For cholesterol 30 tablet 6   cetirizine  (ZYRTEC  ALLERGY) 10 MG tablet Take 1 tablet (10 mg total) by mouth at bedtime. 90 tablet 1   doxycycline  (VIBRAMYCIN ) 100 MG capsule Take 1 capsule (100 mg total) by mouth 2 (two) times daily. 20 capsule 0   fluticasone  (FLONASE ) 50 MCG/ACT nasal spray Place 1 spray into both nostrils daily. Begin by using 2 sprays in each nare daily for 3 to 5 days, then decrease to 1 spray in each nare daily. 15.8 mL 2   hydrochlorothiazide  (HYDRODIURIL ) 50 MG tablet Take 50 mg by mouth daily.     IRON PO Take 45 mg by mouth.     loratadine (CLARITIN) 10 MG tablet Take 10 mg by mouth daily as needed for allergies.     metFORMIN  (GLUCOPHAGE ) 500 MG tablet SMARTSIG:1 Tablet(s) By Mouth Morning-Evening     metFORMIN  (GLUCOPHAGE ) 500 MG tablet Take by mouth 2 (two) times daily with a meal.     methylPREDNISolone   (MEDROL  DOSEPAK) 4 MG TBPK tablet Take per packet instructions 21 each 0   triamcinolone  cream (KENALOG ) 0.5 % Apply 1 Application topically as needed (pain).     valsartan (DIOVAN) 80 MG tablet Take 80 mg by mouth daily.     No current facility-administered medications for this visit.   No results found.  Review of Systems:   A ROS was performed including pertinent positives and negatives as documented in the HPI.  Physical Exam :   Constitutional: NAD and appears stated age Neurological: Alert and oriented Psych: Appropriate affect and cooperative There were no vitals taken for this visit.   Comprehensive Musculoskeletal Exam:    Left knee exam demonstrates tenderness to palpation over the medial joint line.  Mild suprapatellar effusion present, although no large fluid collection is visualized under ultrasound.  Active knee ROM from 0 to 110 degrees.  No instability with varus and valgus rest.  No overlying erythema or warmth.  Imaging:    Assessment:   58 y.o. male with history of chronic bilateral knee pain, left worse than right.  Left knee has become symptomatic again approximately 1 month status post cortisone injection.  Recent MRI of the left knee has shown moderate to severe medial and lateral compartment arthritis in addition to significant degeneration of the medial meniscus.  Given these findings as well as his continued pain and recurrent effusions, I would like to have him follow-up with Dr. Christiane Cowing who he saw back in 2023, for potential discussion of knee replacement.  I did assess knee under ultrasound today which showed a small effusion, but will defer aspiration today.  He can plan to follow-up with me as needed.  Plan :    - Referral to Dr. Christiane Cowing for possible discussion of total knee replacement     I personally saw and evaluated the patient, and participated in the management and treatment plan.   Sharrell Deck, PA-C Orthopedics

## 2023-09-30 DIAGNOSIS — I10 Essential (primary) hypertension: Secondary | ICD-10-CM | POA: Diagnosis not present

## 2023-09-30 DIAGNOSIS — Z6837 Body mass index (BMI) 37.0-37.9, adult: Secondary | ICD-10-CM | POA: Diagnosis not present

## 2023-09-30 DIAGNOSIS — E66812 Obesity, class 2: Secondary | ICD-10-CM | POA: Diagnosis not present

## 2023-11-04 DIAGNOSIS — E782 Mixed hyperlipidemia: Secondary | ICD-10-CM | POA: Diagnosis not present

## 2023-11-04 DIAGNOSIS — Z87898 Personal history of other specified conditions: Secondary | ICD-10-CM | POA: Diagnosis not present

## 2023-11-04 DIAGNOSIS — I10 Essential (primary) hypertension: Secondary | ICD-10-CM | POA: Diagnosis not present

## 2023-11-04 DIAGNOSIS — Z1211 Encounter for screening for malignant neoplasm of colon: Secondary | ICD-10-CM | POA: Diagnosis not present

## 2023-11-04 DIAGNOSIS — N1831 Chronic kidney disease, stage 3a: Secondary | ICD-10-CM | POA: Diagnosis not present

## 2023-11-04 DIAGNOSIS — E669 Obesity, unspecified: Secondary | ICD-10-CM | POA: Diagnosis not present

## 2023-11-15 LAB — COLOGUARD: COLOGUARD: NEGATIVE

## 2023-11-27 ENCOUNTER — Ambulatory Visit (HOSPITAL_BASED_OUTPATIENT_CLINIC_OR_DEPARTMENT_OTHER): Admitting: Student

## 2023-12-10 ENCOUNTER — Ambulatory Visit (INDEPENDENT_AMBULATORY_CARE_PROVIDER_SITE_OTHER): Admitting: Orthopaedic Surgery

## 2023-12-10 DIAGNOSIS — M170 Bilateral primary osteoarthritis of knee: Secondary | ICD-10-CM | POA: Diagnosis not present

## 2023-12-10 DIAGNOSIS — M1711 Unilateral primary osteoarthritis, right knee: Secondary | ICD-10-CM

## 2023-12-10 DIAGNOSIS — M1712 Unilateral primary osteoarthritis, left knee: Secondary | ICD-10-CM

## 2023-12-10 MED ORDER — LIDOCAINE HCL 1 % IJ SOLN
2.0000 mL | INTRAMUSCULAR | Status: AC | PRN
  Administered 2023-12-10: 2 mL

## 2023-12-10 MED ORDER — METHYLPREDNISOLONE ACETATE 40 MG/ML IJ SUSP
40.0000 mg | INTRAMUSCULAR | Status: AC | PRN
  Administered 2023-12-10: 40 mg via INTRA_ARTICULAR

## 2023-12-10 MED ORDER — BUPIVACAINE HCL 0.5 % IJ SOLN
2.0000 mL | INTRAMUSCULAR | Status: AC | PRN
  Administered 2023-12-10: 2 mL via INTRA_ARTICULAR

## 2023-12-10 MED ORDER — BUPIVACAINE HCL 0.5 % IJ SOLN
2.0000 mL | INTRAMUSCULAR | Status: AC | PRN
Start: 2023-12-10 — End: 2023-12-10
  Administered 2023-12-10: 2 mL via INTRA_ARTICULAR

## 2023-12-10 NOTE — Progress Notes (Signed)
 Office Visit Note   Patient: Alex Holt           Date of Birth: 1965/12/12           MRN: [id number] Visit Date: 12/10/2023              Requested by: No referring provider defined for this encounter. PCP: Myra Kawasaki, NP (Inactive)   Assessment & Plan: Visit Diagnoses:  1. Primary osteoarthritis of left knee   2. Primary osteoarthritis of right knee     Plan: History of Present Illness Alex Holt is a 58 year old male who presents with knee pain and consideration for knee replacement surgery. He was referred by Dr. Leonce for evaluation of knee pain and MRI review.  He experiences ongoing knee pain, primarily in the left knee, which has worsened over time. He has received ten injections in the left knee, with the last one on April 1st. He is attempting to lose weight to alleviate symptoms, as weight gain has exacerbated the pain.  Recently, the right knee has also started to hurt, particularly in the last few weeks. An x-ray of the right knee was done in August of the previous year, but only an MRI of the left knee is available. Initially, the right knee was the problem, but now the left knee is more symptomatic.  He is planning a trip to the [facility] in August and is concerned about his ability to walk due to knee pain. He requests injections in both knees to manage pain during this trip.  He has a history of knee issues since childhood, and his MRI noted partially unfused kneecaps. Despite trying to manage the pain with injections and weight loss, the symptoms persist, impacting his daily activities.  Results RADIOLOGY Left knee MRI: Severe osteoarthritis with bone-on-bone contact, cartilage degeneration, and patellar nonunion.  Assessment and Plan Left knee osteoarthritis - left knee injection administered today, will delay TKA for at least 90 days.  Impression is severe left knee degenerative joint disease secondary to Osteoarthritis.  Patient has  attempted conservative treatment for at least 6 consecutive weeks within the past 12 weeks, including but not limited to physical therapy, home exercise program, NSAIDs, activity modification, and/or corticosteroid injections. Despite these efforts, symptoms have not improved or have worsened. Conservative measures have been deemed unsuccessful at this time. After a detailed discussion covering diagnosis and treatment options--including the risks, benefits, alternatives, and potential complications of surgical and nonsurgical management--the patient elected to proceed with surgery  Anticoagulants: No antithrombotic Postop anticoagulation: Eliquis Diabetic: Yes  Nickel allergy: No Prior DVT/PE: No Tobacco use: No Clearances needed for surgery: PCP Anticipated discharge dispo: Home   Osteoarthritis of right knee Recent onset osteoarthritis with swelling and pain, possibly due to compensatory mechanisms from left knee. - Administer knee injection today.  Follow-Up Instructions: No follow-ups on file.   Orders:  No orders of the defined types were placed in this encounter.  No orders of the defined types were placed in this encounter.     Procedures: Large Joint Inj: bilateral knee on 12/10/2023 10:24 AM Indications: pain Details: 22 G needle  Arthrogram: No  Medications (Right): 2 mL lidocaine  1 %; 2 mL bupivacaine  0.5 %; 40 mg methylPREDNISolone  acetate 40 MG/ML Medications (Left): 2 mL lidocaine  1 %; 2 mL bupivacaine  0.5 %; 40 mg methylPREDNISolone  acetate 40 MG/ML Outcome: tolerated well, no immediate complications Patient was prepped and draped in the usual sterile fashion.  Clinical Data: No additional findings.   Subjective: Chief Complaint  Patient presents with   Left Knee - Follow-up    MRI review    HPI  Review of Systems  Constitutional: Negative.   HENT: Negative.    Eyes: Negative.   Respiratory: Negative.    Cardiovascular: Negative.    Gastrointestinal: Negative.   Endocrine: Negative.   Genitourinary: Negative.   Skin: Negative.   Allergic/Immunologic: Negative.   Neurological: Negative.   Hematological: Negative.   Psychiatric/Behavioral: Negative.    All other systems reviewed and are negative.    Objective: Vital Signs: There were no vitals taken for this visit.  Physical Exam Vitals and nursing note reviewed.  Constitutional:      Appearance: He is well-developed.  HENT:     Head: Normocephalic and atraumatic.  Eyes:     Pupils: Pupils are equal, round, and reactive to light.  Pulmonary:     Effort: Pulmonary effort is normal.  Abdominal:     Palpations: Abdomen is soft.  Musculoskeletal:        General: Normal range of motion.     Cervical back: Neck supple.  Skin:    General: Skin is warm.  Neurological:     Mental Status: He is alert and oriented to person, place, and time.  Psychiatric:        Behavior: Behavior normal.        Thought Content: Thought content normal.        Judgment: Judgment normal.     Ortho Exam  Specialty Comments:  No specialty comments available.  Imaging: No results found.   PMFS History: Patient Active Problem List   Diagnosis Date Noted   Primary osteoarthritis of left knee 01/25/2022   Allergic rhinitis 12/23/2013   Flea bite of multiple sites 12/23/2013   Left ankle sprain 08/17/2013   CTS (carpal tunnel syndrome) 04/29/2012   Plantar fasciitis 04/10/2012   Insomnia 11/14/2010   Type 2 diabetes mellitus with obesity (HCC) 11/14/2010   Chronic mental illness 09/25/2010   ANEMIA 03/08/2010   GERD 03/08/2010   HYPERLIPIDEMIA 01/30/2010   OBESITY 01/30/2010   Essential hypertension 01/30/2010   Past Medical History:  Diagnosis Date   Acid reflux    Diabetes mellitus without complication (HCC)    Hyperlipidemia    Hypertension    Schizophrenia (HCC)     Family History  Problem Relation Age of Onset   Mental illness Mother    Mental  illness Other        FAMILY HISTORY    Diabetes Other     No past surgical history on file. Social History   Occupational History   Occupation: sELF EMPLOYED     Comment: [employer]   Tobacco Use   Smoking status: Never   Smokeless tobacco: Never  Vaping Use   Vaping status: Never Used  Substance and Sexual Activity   Alcohol use: Not Currently    Comment: Stopped drinked in Sept '24   Drug use: No   Sexual activity: Yes

## 2023-12-26 ENCOUNTER — Other Ambulatory Visit: Payer: Self-pay | Admitting: Nephrology

## 2023-12-26 DIAGNOSIS — N178 Other acute kidney failure: Secondary | ICD-10-CM | POA: Diagnosis not present

## 2023-12-26 DIAGNOSIS — E66812 Obesity, class 2: Secondary | ICD-10-CM | POA: Diagnosis not present

## 2023-12-26 DIAGNOSIS — I10 Essential (primary) hypertension: Secondary | ICD-10-CM | POA: Diagnosis not present

## 2023-12-26 DIAGNOSIS — Z6837 Body mass index (BMI) 37.0-37.9, adult: Secondary | ICD-10-CM | POA: Diagnosis not present

## 2023-12-26 DIAGNOSIS — N144 Toxic nephropathy, not elsewhere classified: Secondary | ICD-10-CM | POA: Diagnosis not present

## 2023-12-26 DIAGNOSIS — N1832 Chronic kidney disease, stage 3b: Secondary | ICD-10-CM

## 2023-12-26 DIAGNOSIS — D509 Iron deficiency anemia, unspecified: Secondary | ICD-10-CM | POA: Diagnosis not present

## 2023-12-26 DIAGNOSIS — E785 Hyperlipidemia, unspecified: Secondary | ICD-10-CM | POA: Diagnosis not present

## 2024-01-10 ENCOUNTER — Telehealth: Payer: Self-pay | Admitting: Orthopaedic Surgery

## 2024-01-10 NOTE — Telephone Encounter (Signed)
 Called patient to let him know we have clearance from PCP for left total knee with Dr. Jerri.  Unable to leave message because voicemail is full.  Tried calling spouse but the phone continues to ring with no answer. Surgery date must be after 03-11-24 per Dr. Jerri.

## 2024-03-12 DIAGNOSIS — Z23 Encounter for immunization: Secondary | ICD-10-CM | POA: Diagnosis not present

## 2024-03-12 DIAGNOSIS — E669 Obesity, unspecified: Secondary | ICD-10-CM | POA: Diagnosis not present

## 2024-03-12 DIAGNOSIS — B351 Tinea unguium: Secondary | ICD-10-CM | POA: Diagnosis not present

## 2024-03-12 DIAGNOSIS — E119 Type 2 diabetes mellitus without complications: Secondary | ICD-10-CM | POA: Diagnosis not present

## 2024-03-12 DIAGNOSIS — Z139 Encounter for screening, unspecified: Secondary | ICD-10-CM | POA: Diagnosis not present

## 2024-03-12 DIAGNOSIS — I10 Essential (primary) hypertension: Secondary | ICD-10-CM | POA: Diagnosis not present

## 2024-03-26 ENCOUNTER — Telehealth: Payer: Self-pay | Admitting: Orthopaedic Surgery

## 2024-03-26 NOTE — Telephone Encounter (Signed)
 Pt came into office to set knee surgery replacement with Dr Jerri. Pt ask for Debbie to please call pt ASAP at [phone].

## 2024-03-30 ENCOUNTER — Ambulatory Visit

## 2024-04-14 NOTE — Telephone Encounter (Signed)
 Patient needs to return for updated left knee x-rays.  This appointment can be made as nurse only visit.  Not able to reach patient because voicemail is full.  Several attempts have been made to contact patient. Patient is scheduled for left total knee arthroplasty with Dr Jerri 05-04-24 and these x-rays are need prior to surgery date.

## 2024-04-15 ENCOUNTER — Ambulatory Visit: Admitting: Orthopaedic Surgery

## 2024-04-15 ENCOUNTER — Other Ambulatory Visit (INDEPENDENT_AMBULATORY_CARE_PROVIDER_SITE_OTHER): Payer: Self-pay

## 2024-04-15 DIAGNOSIS — M1712 Unilateral primary osteoarthritis, left knee: Secondary | ICD-10-CM

## 2024-04-16 NOTE — Progress Notes (Signed)
 Xrays for surgery

## 2024-04-22 ENCOUNTER — Other Ambulatory Visit: Payer: Self-pay | Admitting: Physician Assistant

## 2024-04-22 MED ORDER — DOXYCYCLINE HYCLATE 100 MG PO CAPS: 100.0000 mg | ORAL_CAPSULE | Freq: Two times a day (BID) | ORAL | 0 refills | Status: AC

## 2024-04-22 MED ORDER — ONDANSETRON HCL 4 MG PO TABS: 4.0000 mg | ORAL_TABLET | Freq: Three times a day (TID) | ORAL | 0 refills | Status: AC | PRN

## 2024-04-22 MED ORDER — DOCUSATE SODIUM 100 MG PO CAPS: 100.0000 mg | ORAL_CAPSULE | Freq: Every day | ORAL | 2 refills | Status: AC | PRN

## 2024-04-22 MED ORDER — OXYCODONE-ACETAMINOPHEN 5-325 MG PO TABS: 1.0000 | ORAL_TABLET | Freq: Four times a day (QID) | ORAL | 0 refills | Status: DC | PRN

## 2024-04-23 ENCOUNTER — Encounter: Payer: Self-pay | Admitting: Podiatry

## 2024-04-23 ENCOUNTER — Other Ambulatory Visit: Payer: Self-pay

## 2024-04-23 ENCOUNTER — Ambulatory Visit
Admission: EM | Admit: 2024-04-23 | Discharge: 2024-04-23 | Disposition: A | Discharge status: Home / Self Care | Attending: Family Medicine | Admitting: Family Medicine

## 2024-04-23 ENCOUNTER — Ambulatory Visit: Admitting: Podiatry

## 2024-04-23 ENCOUNTER — Ambulatory Visit

## 2024-04-23 DIAGNOSIS — B351 Tinea unguium: Secondary | ICD-10-CM

## 2024-04-23 DIAGNOSIS — M2141 Flat foot [pes planus] (acquired), right foot: Secondary | ICD-10-CM

## 2024-04-23 DIAGNOSIS — E669 Obesity, unspecified: Secondary | ICD-10-CM | POA: Diagnosis not present

## 2024-04-23 DIAGNOSIS — L600 Ingrowing nail: Secondary | ICD-10-CM | POA: Diagnosis not present

## 2024-04-23 DIAGNOSIS — M79675 Pain in left toe(s): Secondary | ICD-10-CM | POA: Diagnosis not present

## 2024-04-23 DIAGNOSIS — E119 Type 2 diabetes mellitus without complications: Secondary | ICD-10-CM | POA: Diagnosis not present

## 2024-04-23 DIAGNOSIS — M2142 Flat foot [pes planus] (acquired), left foot: Secondary | ICD-10-CM

## 2024-04-23 DIAGNOSIS — M79674 Pain in right toe(s): Secondary | ICD-10-CM | POA: Diagnosis not present

## 2024-04-23 MED ORDER — CICLOPIROX 8 % EX SOLN: Freq: Every day | CUTANEOUS | 0 refills | Status: AC

## 2024-04-23 NOTE — Discharge Instructions (Addendum)
 May start topical antifungal treatment to the right great toenail daily.  Please contact podiatry at the information listed below as soon as possible to set up an appointment for further evaluation of your nail.  Please go to the emergency room if you develop any worsening symptoms.  I hope you feel better soon!

## 2024-04-23 NOTE — ED Provider Notes (Signed)
 [facility]    CSN: [id number] Arrival date & time: 04/23/24  1355      History   Chief Complaint Chief Complaint  Patient presents with   Ingrown Toenail    HPI Alex Holt is a 58 y.o. male presents for toe pain.  Patient reports 2 days of right great toenail pain.  States he believes it is also ingrown.  Denies any known injury but he did cut his toenails recently.  He also thinks he may have toenail fungus.  Denies any swelling, redness or warmth of the toe.  No drainage.  No fevers or chills.  No other concerns at this time.  HPI  Past Medical History:  Diagnosis Date   Acid reflux    Diabetes mellitus without complication (HCC)    Hyperlipidemia    Hypertension    Schizophrenia [facility])     Patient Active Problem List   Diagnosis Date Noted   Primary osteoarthritis of left knee 01/25/2022   Allergic rhinitis 12/23/2013   Flea bite of multiple sites 12/23/2013   Left ankle sprain 08/17/2013   CTS (carpal tunnel syndrome) 04/29/2012   Plantar fasciitis 04/10/2012   Insomnia 11/14/2010   Type 2 diabetes mellitus with obesity 11/14/2010   Chronic mental illness 09/25/2010   ANEMIA 03/08/2010   GERD 03/08/2010   HYPERLIPIDEMIA 01/30/2010   OBESITY 01/30/2010   Essential hypertension 01/30/2010    History reviewed. No pertinent surgical history.     Home Medications    Prior to Admission medications   Medication Sig Start Date End Date Taking? Authorizing Provider  ciclopirox (PENLAC) 8 % solution Apply topically at bedtime. Apply over nail and surrounding skin. Apply daily over previous coat. After seven (7) days, may remove with alcohol and continue cycle. 04/23/24  Yes Jasmine Maceachern, Jodi R, NP  amLODipine  (NORVASC ) 10 MG tablet Take 1 tablet by mouth daily. 01/28/23   [provider]  atorvastatin  (LIPITOR) 20 MG tablet Take 1 tablet (20 mg total) by mouth at bedtime. For cholesterol 09/07/13   Luke, Theodoro FALCON, MD  cetirizine  (ZYRTEC  ALLERGY)  10 MG tablet Take 1 tablet (10 mg total) by mouth at bedtime. 02/05/22 04/29/23  Joesph Shaver Scales, PA-C  docusate sodium (COLACE) 100 MG capsule Take 1 capsule (100 mg total) by mouth daily as needed. 04/22/24 04/22/25  Jule Ronal CROME, PA-C  doxycycline  (VIBRAMYCIN ) 100 MG capsule Take 1 capsule (100 mg total) by mouth 2 (two) times daily. To be taken after surgery 04/22/24   Jule Ronal CROME, PA-C  fluticasone  (FLONASE ) 50 MCG/ACT nasal spray Place 1 spray into both nostrils daily. Begin by using 2 sprays in each nare daily for 3 to 5 days, then decrease to 1 spray in each nare daily. 02/05/22   Joesph Shaver Scales, PA-C  hydrochlorothiazide  (HYDRODIURIL ) 50 MG tablet Take 50 mg by mouth daily.    [provider]  IRON PO Take 45 mg by mouth.    [provider]  loratadine (CLARITIN) 10 MG tablet Take 10 mg by mouth daily as needed for allergies. 11/18/18   [provider]  meloxicam  (MOBIC ) 15 MG tablet Take 1 tablet (15 mg total) by mouth daily. 09/26/23   Overturf, Jackson L, PA-C  metFORMIN  (GLUCOPHAGE ) 500 MG tablet SMARTSIG:1 Tablet(s) By Mouth Morning-Evening 12/28/21   [provider]  metFORMIN  (GLUCOPHAGE ) 500 MG tablet Take by mouth 2 (two) times daily with a meal.    [provider]  methylPREDNISolone  (MEDROL  DOSEPAK) 4  MG TBPK tablet Take per packet instructions 04/05/23   Emiliano Leonce CROME, PA-C  ondansetron  (ZOFRAN ) 4 MG tablet Take 1 tablet (4 mg total) by mouth every 8 (eight) hours as needed for nausea or vomiting. 04/22/24   Jule Ronal CROME, PA-C  oxyCODONE -acetaminophen  (PERCOCET) 5-325 MG tablet Take 1-2 tablets by mouth every 6 (six) hours as needed. To be taken after surgery 04/22/24   Jule Ronal CROME, PA-C  triamcinolone  cream (KENALOG ) 0.5 % Apply 1 Application topically as needed (pain). 11/21/18   [provider]  valsartan (DIOVAN) 80 MG tablet Take 80 mg by mouth daily.    [provider]    Family  History Family History  Problem Relation Age of Onset   Mental illness Mother    Mental illness Other        FAMILY HISTORY    Diabetes Other     Social History Social History   Tobacco Use   Smoking status: Never   Smokeless tobacco: Never  Vaping Use   Vaping status: Never Used  Substance Use Topics   Alcohol use: Not Currently    Comment: Stopped drinked in Sept '24   Drug use: No     Allergies   Patient has no known allergies.   Review of Systems Review of Systems  Musculoskeletal:        Great toenail concern     Physical Exam Triage Vital Signs ED Triage Vitals  Encounter Vitals Group     BP 04/23/24 1515 (!) 136/92     Girls Systolic BP Percentile --      Girls Diastolic BP Percentile --      Boys Systolic BP Percentile --      Boys Diastolic BP Percentile --      Pulse Rate 04/23/24 1515 94     Resp 04/23/24 1515 18     Temp 04/23/24 1515 98 F (36.7 C)     Temp Source 04/23/24 1515 Oral     SpO2 04/23/24 1515 97 %     Weight --      Height --      Head Circumference --      Peak Flow --      Pain Score 04/23/24 1512 10     Pain Loc --      Pain Education --      Exclude from Growth Chart --    No data found.  Updated Vital Signs BP (!) 136/92   Pulse 94   Temp 98 F (36.7 C) (Oral)   Resp 18   SpO2 97%   Visual Acuity Right Eye Distance:   Left Eye Distance:   Bilateral Distance:    Right Eye Near:   Left Eye Near:    Bilateral Near:     Physical Exam Vitals and nursing note reviewed.  Constitutional:      General: He is not in acute distress.    Appearance: Normal appearance. He is not ill-appearing.  HENT:     Head: Normocephalic and atraumatic.  Eyes:     Pupils: Pupils are equal, round, and reactive to light.  Cardiovascular:     Rate and Rhythm: Normal rate.  Pulmonary:     Effort: Pulmonary effort is normal.  Feet:     Right foot:     Toenail Condition: Right toenails are ingrown. Fungal disease present.     Comments: Great toenail is ingrown to the medial aspect as well as presenting with fungal infection  Skin:    General: Skin is warm and dry.  Neurological:     General: No focal deficit present.     Mental Status: He is alert and oriented to person, place, and time.  Psychiatric:        Mood and Affect: Mood normal.        Behavior: Behavior normal.      UC Treatments / Results  Labs (all labs ordered are listed, but only abnormal results are displayed) Labs Reviewed - No data to display  EKG   Radiology No results found.  Procedures Procedures (including critical care time)  Medications Ordered in UC Medications - No data to display  Initial Impression / Assessment and Plan / UC Course  I have reviewed the triage vital signs and the nursing notes.  Pertinent labs & imaging results that were available during my care of the patient were reviewed by me and considered in my medical decision making (see chart for details).     I reviewed exam and symptoms with patient.  Will have him follow-up with podiatry for his ingrown toenail.  May do Epsom salt soaks.  No signs of infection at this time.  Discussed nail fungus as well, will do Penlac topically until he sees podiatry in which they may change treatment.  ER precautions reviewed and patient verbalized understanding. Final Clinical Impressions(s) / UC Diagnoses   Final diagnoses:  Onychomycosis of right great toe  Ingrown right big toenail     Discharge Instructions      May start topical antifungal treatment to the right great toenail daily.  Please contact podiatry at the information listed below as soon as possible to set up an appointment for further evaluation of your nail.  Please go to the emergency room if you develop any worsening symptoms.  I hope you feel better soon!     ED Prescriptions     Medication Sig Dispense Auth. Provider   ciclopirox (PENLAC) 8 % solution Apply topically at bedtime. Apply  over nail and surrounding skin. Apply daily over previous coat. After seven (7) days, may remove with alcohol and continue cycle. 6.6 mL Latissa Frick, Jodi R, NP      PDMP not reviewed this encounter.   Loreda Myla SAUNDERS, NP 04/23/24 1534

## 2024-04-23 NOTE — ED Triage Notes (Signed)
 Pt c/o pain in right big toe nail pain. Pt's nail is ecchymotic.

## 2024-04-23 NOTE — Progress Notes (Unsigned)
  Subjective:  Patient ID: Alex Holt, male    DOB: 05/22/1965,  MRN: [id number]  Chief Complaint  Patient presents with   Ingrown Toenail    Patient is here for right lateral hallux border    58 y.o. male presents with the above complaint. History confirmed with patient. Patient presenting with pain related to dystrophic thickened elongated nails. Patient is unable to trim own nails related to nail dystrophy. Patient does have a history of T2DM.   Objective:  Physical Exam: warm, good capillary refill nail exam onychomycosis of the toenails, onycholysis, and dystrophic nails greater than 3 mm thickening.  There is some mild incurvation of right hallux medial border. DP pulses palpable, PT pulses palpable, and protective sensation intact Left Foot:  Pain with palpation of nails due to elongation and dystrophic growth.  Right Foot: Pain with palpation of nails due to elongation and dystrophic growth, right first toenail especially painful to dorsal aspect greater than nail borders. Mild bunion deformities and pes planus foot type noted Muscle strength 5/5 in dorsiflexion, plantarflexion inversion and eversion. Assessment:   1. Pain due to onychomycosis of toenails of both feet   2. Type 2 diabetes mellitus in patient with obesity (HCC)   3. Pes planus of both feet      Plan:  Patient was evaluated and treated and all questions answered.   #Onychomycosis with pain  -Nails palliatively debrided as below. -Educated on self-care - PCP starting patient on topical Penlac  - Right hallux medial nail border not significantly ingrown, pain relieved with nail debridement can follow-up as needed if this recurs if needing to proceed with ingrown toenail removal.  Procedure: Nail Debridement Rationale: Pain Type of Debridement: manual, sharp debridement. Instrumentation: Nail nipper, rotary burr. Number of Nails: 10  Patient educated on diabetes. Discussed proper diabetic foot care and  discussed risks and complications of disease. Educated patient in depth on reasons to return to the office immediately should he/she discover anything concerning or new on the feet. All questions answered. Discussed proper shoes as well.    Return in about 3 months (around 07/22/2024) for Diabetic Foot Care.         Ethan Saddler, DPM [facility]

## 2024-05-01 ENCOUNTER — Encounter (HOSPITAL_COMMUNITY): Payer: Self-pay | Admitting: Orthopaedic Surgery

## 2024-05-01 ENCOUNTER — Other Ambulatory Visit: Payer: Self-pay

## 2024-05-01 NOTE — Progress Notes (Signed)
 SDW CALL  Patient was given pre-op instructions over the phone. The opportunity was given for the patient to ask questions. No further questions asked. Patient verbalized understanding of instructions given.   PCP - Starleen Perri Raddle, MD Cardiologist - denies  PPM/ICD - denies Device Orders -  Rep Notified -   Chest x-ray - na EKG - DOS Stress Test - denies ECHO - denies Cardiac Cath - denies  Sleep Study - not asked CPAP -   Fasting Blood Sugar - patient is unsure. He states his wife keeps a record of his blood sugars and she was not at home at time of call  Checks Blood Sugar every morning Pt was taking Wegovy,but stopped in September when insurance stopped paying for it.  Blood Thinner Instructions:na Aspirin Instructions:na  ERAS Protcol - clears until 0545. PRE-SURGERY Ensure or G2-   COVID TEST- na   Anesthesia review: no  Patient denies shortness of breath, fever, cough and chest pain over the phone call  As of today, STOP taking any Aspirin (unless otherwise instructed by your surgeon) Aleve , Naproxen , Ibuprofen, Motrin, Advil, Goody's, BC's, all herbal medications, fish oil, and all vitamins. This includes your Mobic (meloxicam ).   Special instructions:    Oral Hygiene is also important to reduce your risk of infection.  Remember - BRUSH YOUR TEETH THE MORNING OF SURGERY WITH YOUR REGULAR TOOTHPASTE

## 2024-05-04 ENCOUNTER — Encounter (HOSPITAL_COMMUNITY): Payer: Self-pay | Admitting: Orthopaedic Surgery

## 2024-05-04 ENCOUNTER — Observation Stay (HOSPITAL_COMMUNITY)
Admission: RE | Admit: 2024-05-04 | Discharge: 2024-05-05 | Disposition: A | Attending: Orthopaedic Surgery | Admitting: Orthopaedic Surgery

## 2024-05-04 ENCOUNTER — Ambulatory Visit (HOSPITAL_COMMUNITY)

## 2024-05-04 ENCOUNTER — Other Ambulatory Visit: Payer: Self-pay | Admitting: Physician Assistant

## 2024-05-04 ENCOUNTER — Other Ambulatory Visit: Payer: Self-pay

## 2024-05-04 ENCOUNTER — Encounter (HOSPITAL_COMMUNITY): Admission: RE | Disposition: A | Payer: Self-pay | Source: Home / Self Care | Attending: Orthopaedic Surgery

## 2024-05-04 ENCOUNTER — Observation Stay (HOSPITAL_COMMUNITY)

## 2024-05-04 DIAGNOSIS — I10 Essential (primary) hypertension: Secondary | ICD-10-CM | POA: Diagnosis not present

## 2024-05-04 DIAGNOSIS — Z471 Aftercare following joint replacement surgery: Secondary | ICD-10-CM | POA: Diagnosis not present

## 2024-05-04 DIAGNOSIS — Z79899 Other long term (current) drug therapy: Secondary | ICD-10-CM | POA: Diagnosis not present

## 2024-05-04 DIAGNOSIS — R609 Edema, unspecified: Secondary | ICD-10-CM | POA: Diagnosis not present

## 2024-05-04 DIAGNOSIS — Z7984 Long term (current) use of oral hypoglycemic drugs: Secondary | ICD-10-CM | POA: Diagnosis not present

## 2024-05-04 DIAGNOSIS — M25562 Pain in left knee: Secondary | ICD-10-CM | POA: Diagnosis not present

## 2024-05-04 DIAGNOSIS — M1712 Unilateral primary osteoarthritis, left knee: Secondary | ICD-10-CM

## 2024-05-04 DIAGNOSIS — Z96652 Presence of left artificial knee joint: Secondary | ICD-10-CM

## 2024-05-04 DIAGNOSIS — E119 Type 2 diabetes mellitus without complications: Secondary | ICD-10-CM | POA: Diagnosis not present

## 2024-05-04 HISTORY — PX: TOTAL KNEE ARTHROPLASTY: SHX125

## 2024-05-04 LAB — BASIC METABOLIC PANEL WITH GFR
Anion gap: 13 (ref 5–15)
BUN: 18 mg/dL (ref 6–20)
CO2: 23 mmol/L (ref 22–32)
Calcium: 9.6 mg/dL (ref 8.9–10.3)
Chloride: 103 mmol/L (ref 98–111)
Creatinine, Ser: 1.22 mg/dL (ref 0.61–1.24)
GFR, Estimated: >60 mL/min (ref >60)
Glucose, Bld: 92 mg/dL (ref 70–99)
Potassium: 3.8 mmol/L (ref 3.5–5.1)
Sodium: 139 mmol/L (ref 135–145)

## 2024-05-04 LAB — CBC
HCT: 36.3 % — ABNORMAL LOW (ref 39.0–52.0)
Hemoglobin: 11.4 g/dL — ABNORMAL LOW (ref 13.0–17.0)
MCH: 21.7 pg — ABNORMAL LOW (ref 26.0–34.0)
MCHC: 31.4 g/dL (ref 30.0–36.0)
MCV: 69 fL — ABNORMAL LOW (ref 80.0–100.0)
Platelets: 459 K/uL — ABNORMAL HIGH (ref 150–400)
RBC: 5.26 MIL/uL (ref 4.22–5.81)
RDW: 17.1 % — ABNORMAL HIGH (ref 11.5–15.5)
WBC: 7.1 K/uL (ref 4.0–10.5)
nRBC: 0 % (ref 0.0–0.2)

## 2024-05-04 LAB — SURGICAL PCR SCREEN
MRSA, PCR: NEGATIVE
Staphylococcus aureus: NEGATIVE

## 2024-05-04 LAB — GLUCOSE, CAPILLARY
Glucose-Capillary: 110 mg/dL — ABNORMAL HIGH (ref 70–99)
Glucose-Capillary: 96 mg/dL (ref 70–99)
Glucose-Capillary: 94 mg/dL (ref 70–99)
Glucose-Capillary: 94 mg/dL (ref 70–99)
Glucose-Capillary: 100 mg/dL — ABNORMAL HIGH (ref 70–99)

## 2024-05-04 SURGERY — ARTHROPLASTY, KNEE, TOTAL
Anesthesia: Spinal | Site: Knee | Laterality: Left

## 2024-05-04 MED ORDER — TRANEXAMIC ACID-NACL 1000-0.7 MG/100ML-% IV SOLN
1000.0000 mg | Freq: Once | INTRAVENOUS | Status: AC
  Administered 2024-05-04: 15:00:00 1000 mg via INTRAVENOUS
  Filled 2024-05-04: qty 100

## 2024-05-04 MED ORDER — FENTANYL CITRATE (PF) 100 MCG/2ML IJ SOLN: 100.0000 ug | Freq: Once | INTRAMUSCULAR | Status: AC

## 2024-05-04 MED ORDER — APIXABAN 2.5 MG PO TABS
ORAL_TABLET | ORAL | 0 refills | Status: AC
  Filled 2024-05-04: qty 60, 30d supply, fill #0

## 2024-05-04 MED ORDER — SODIUM CHLORIDE 0.9 % IV SOLN: INTRAVENOUS | Status: DC

## 2024-05-04 MED ORDER — METOCLOPRAMIDE HCL 5 MG PO TABS
5.0000 mg | ORAL_TABLET | Freq: Three times a day (TID) | ORAL | Status: DC | PRN
  Filled 2024-05-04: qty 2

## 2024-05-04 MED ORDER — CEFAZOLIN SODIUM-DEXTROSE 2-4 GM/100ML-% IV SOLN
2.0000 g | INTRAVENOUS | Status: AC
  Administered 2024-05-04: 09:00:00 2 g via INTRAVENOUS
  Filled 2024-05-04: qty 100

## 2024-05-04 MED ORDER — BUPIVACAINE-EPINEPHRINE (PF) 0.5% -1:200000 IJ SOLN
INTRAMUSCULAR | Status: DC | PRN
  Administered 2024-05-04: 09:00:00 20 mL via PERINEURAL

## 2024-05-04 MED ORDER — ALBUMIN HUMAN 5 % IV SOLN: INTRAVENOUS | Status: DC | PRN

## 2024-05-04 MED ORDER — FENTANYL CITRATE (PF) 100 MCG/2ML IJ SOLN: 25.0000 ug | INTRAMUSCULAR | Status: DC | PRN

## 2024-05-04 MED ORDER — IRBESARTAN 75 MG PO TABS: 75.0000 mg | ORAL_TABLET | Freq: Every day | ORAL | Status: DC

## 2024-05-04 MED ORDER — ONDANSETRON HCL 4 MG/2ML IJ SOLN: 4.0000 mg | Freq: Four times a day (QID) | INTRAMUSCULAR | Status: DC | PRN

## 2024-05-04 MED ORDER — METHOCARBAMOL 500 MG PO TABS
500.0000 mg | ORAL_TABLET | Freq: Four times a day (QID) | ORAL | Status: DC | PRN
  Administered 2024-05-04 – 2024-05-05 (×3): 500 mg via ORAL
  Filled 2024-05-04 (×3): qty 1

## 2024-05-04 MED ORDER — VANCOMYCIN HCL 1000 MG IV SOLR
INTRAVENOUS | Status: AC
  Filled 2024-05-04: qty 20

## 2024-05-04 MED ORDER — HYDROCHLOROTHIAZIDE 50 MG PO TABS
50.0000 mg | ORAL_TABLET | Freq: Every day | ORAL | Status: DC
  Filled 2024-05-04: qty 1

## 2024-05-04 MED ORDER — SODIUM CHLORIDE 0.9 % IR SOLN
Status: DC | PRN
  Administered 2024-05-04: 10:00:00 1000 mL

## 2024-05-04 MED ORDER — METHOCARBAMOL 1000 MG/10ML IJ SOLN: 500.0000 mg | Freq: Four times a day (QID) | INTRAMUSCULAR | Status: DC | PRN

## 2024-05-04 MED ORDER — PROPOFOL 500 MG/50ML IV EMUL
INTRAVENOUS | Status: DC | PRN
  Administered 2024-05-04: 09:00:00 125 ug/kg/min via INTRAVENOUS

## 2024-05-04 MED ORDER — 0.9 % SODIUM CHLORIDE (POUR BTL) OPTIME
TOPICAL | Status: DC | PRN
  Administered 2024-05-04: 10:00:00 1000 mL

## 2024-05-04 MED ORDER — VANCOMYCIN HCL 1000 MG IV SOLR
INTRAVENOUS | Status: DC | PRN
  Administered 2024-05-04: 10:00:00 1000 mg

## 2024-05-04 MED ORDER — DOXYCYCLINE HYCLATE 100 MG PO TABS
100.0000 mg | ORAL_TABLET | Freq: Two times a day (BID) | ORAL | Status: DC
  Administered 2024-05-04: 22:00:00 100 mg via ORAL
  Filled 2024-05-04: qty 1

## 2024-05-04 MED ORDER — PRONTOSAN WOUND IRRIGATION OPTIME
TOPICAL | Status: DC | PRN
  Administered 2024-05-04: 10:00:00 350 mL

## 2024-05-04 MED ORDER — LACTATED RINGERS IV SOLN: INTRAVENOUS | Status: DC

## 2024-05-04 MED ORDER — CHLORHEXIDINE GLUCONATE 0.12 % MT SOLN
15.0000 mL | Freq: Once | OROMUCOSAL | Status: AC
  Administered 2024-05-04: 08:00:00 15 mL via OROMUCOSAL
  Filled 2024-05-04: qty 15

## 2024-05-04 MED ORDER — ORAL CARE MOUTH RINSE: 15.0000 mL | Freq: Once | OROMUCOSAL | Status: AC

## 2024-05-04 MED ORDER — PHENYLEPHRINE HCL-NACL 20-0.9 MG/250ML-% IV SOLN
INTRAVENOUS | Status: DC | PRN
  Administered 2024-05-04: 09:00:00 30 ug/min via INTRAVENOUS

## 2024-05-04 MED ORDER — INSULIN ASPART 100 UNIT/ML IJ SOLN: 0.0000 [IU] | INTRAMUSCULAR | Status: DC | PRN

## 2024-05-04 MED ORDER — FENTANYL CITRATE (PF) 100 MCG/2ML IJ SOLN
INTRAMUSCULAR | Status: AC
  Administered 2024-05-04: 09:00:00 100 ug via INTRAVENOUS
  Filled 2024-05-04: qty 2

## 2024-05-04 MED ORDER — TRANEXAMIC ACID-NACL 1000-0.7 MG/100ML-% IV SOLN
1000.0000 mg | INTRAVENOUS | Status: AC
  Administered 2024-05-04: 09:00:00 1000 mg via INTRAVENOUS
  Filled 2024-05-04: qty 100

## 2024-05-04 MED ORDER — BUPIVACAINE-MELOXICAM ER 400-12 MG/14ML IJ SOLN
INTRAMUSCULAR | Status: DC | PRN
  Administered 2024-05-04: 10:00:00 400 mg

## 2024-05-04 MED ORDER — DEXAMETHASONE SOD PHOSPHATE PF 10 MG/ML IJ SOLN
10.0000 mg | Freq: Once | INTRAMUSCULAR | Status: AC
  Administered 2024-05-05: 06:00:00 10 mg via INTRAVENOUS

## 2024-05-04 MED ORDER — METOCLOPRAMIDE HCL 5 MG/ML IJ SOLN: 5.0000 mg | Freq: Three times a day (TID) | INTRAMUSCULAR | Status: DC | PRN

## 2024-05-04 MED ORDER — BUPIVACAINE IN DEXTROSE 0.75-8.25 % IT SOLN
INTRATHECAL | Status: DC | PRN
  Administered 2024-05-04: 09:00:00 1.8 mL via INTRATHECAL

## 2024-05-04 MED ORDER — PROPOFOL 10 MG/ML IV BOLUS
INTRAVENOUS | Status: AC
  Filled 2024-05-04: qty 20

## 2024-05-04 MED ORDER — OXYCODONE HCL 5 MG PO TABS: 5.0000 mg | ORAL_TABLET | Freq: Once | ORAL | Status: DC | PRN

## 2024-05-04 MED ORDER — TRANEXAMIC ACID 1000 MG/10ML IV SOLN
INTRAVENOUS | Status: DC | PRN
  Administered 2024-05-04: 10:00:00 2000 mg via TOPICAL

## 2024-05-04 MED ORDER — DOCUSATE SODIUM 100 MG PO CAPS
100.0000 mg | ORAL_CAPSULE | Freq: Two times a day (BID) | ORAL | Status: DC
  Administered 2024-05-04 (×2): 100 mg via ORAL
  Filled 2024-05-04 (×2): qty 1

## 2024-05-04 MED ORDER — OXYCODONE HCL 5 MG PO TABS: 5.0000 mg | ORAL_TABLET | Freq: Four times a day (QID) | ORAL | Status: DC | PRN

## 2024-05-04 MED ORDER — PHENYLEPHRINE 80 MCG/ML (10ML) SYRINGE FOR IV PUSH (FOR BLOOD PRESSURE SUPPORT)
PREFILLED_SYRINGE | INTRAVENOUS | Status: DC | PRN
  Administered 2024-05-04: 09:00:00 160 ug via INTRAVENOUS

## 2024-05-04 MED ORDER — MIDAZOLAM HCL 2 MG/2ML IJ SOLN
INTRAMUSCULAR | Status: AC
  Administered 2024-05-04: 09:00:00 2 mg via INTRAVENOUS
  Filled 2024-05-04: qty 2

## 2024-05-04 MED ORDER — TRANEXAMIC ACID 1000 MG/10ML IV SOLN
2000.0000 mg | INTRAVENOUS | Status: DC
  Filled 2024-05-04: qty 20

## 2024-05-04 MED ORDER — APIXABAN 2.5 MG PO TABS
2.5000 mg | ORAL_TABLET | Freq: Two times a day (BID) | ORAL | Status: DC
  Administered 2024-05-05: 06:00:00 2.5 mg via ORAL
  Filled 2024-05-04: qty 1

## 2024-05-04 MED ORDER — POVIDONE-IODINE 10 % EX SWAB
2.0000 | Freq: Once | CUTANEOUS | Status: AC
  Administered 2024-05-04: 08:00:00 2 via TOPICAL

## 2024-05-04 MED ORDER — MENTHOL 3 MG MT LOZG: 1.0000 | LOZENGE | OROMUCOSAL | Status: DC | PRN

## 2024-05-04 MED ORDER — BUPIVACAINE-MELOXICAM ER 400-12 MG/14ML IJ SOLN
INTRAMUSCULAR | Status: AC
  Filled 2024-05-04: qty 1

## 2024-05-04 MED ORDER — OXYCODONE HCL 5 MG PO TABS
10.0000 mg | ORAL_TABLET | Freq: Four times a day (QID) | ORAL | Status: DC | PRN
  Administered 2024-05-04 – 2024-05-05 (×3): 10 mg via ORAL
  Filled 2024-05-04 (×3): qty 2

## 2024-05-04 MED ORDER — AMLODIPINE BESYLATE 10 MG PO TABS
10.0000 mg | ORAL_TABLET | Freq: Every day | ORAL | Status: DC
  Administered 2024-05-04: 15:00:00 10 mg via ORAL
  Filled 2024-05-04: qty 1

## 2024-05-04 MED ORDER — ACETAMINOPHEN 10 MG/ML IV SOLN: 1000.0000 mg | Freq: Once | INTRAVENOUS | Status: DC | PRN

## 2024-05-04 MED ORDER — KETOROLAC TROMETHAMINE 15 MG/ML IJ SOLN
7.5000 mg | Freq: Four times a day (QID) | INTRAMUSCULAR | Status: AC
  Administered 2024-05-04 – 2024-05-05 (×4): 7.5 mg via INTRAVENOUS
  Filled 2024-05-04 (×4): qty 1

## 2024-05-04 MED ORDER — CEFAZOLIN SODIUM-DEXTROSE 2-4 GM/100ML-% IV SOLN
2.0000 g | Freq: Four times a day (QID) | INTRAVENOUS | Status: AC
  Administered 2024-05-04 (×2): 2 g via INTRAVENOUS
  Filled 2024-05-04 (×2): qty 100

## 2024-05-04 MED ORDER — ONDANSETRON HCL 4 MG PO TABS: 4.0000 mg | ORAL_TABLET | Freq: Four times a day (QID) | ORAL | Status: DC | PRN

## 2024-05-04 MED ORDER — ACETAMINOPHEN 500 MG PO TABS
1000.0000 mg | ORAL_TABLET | Freq: Four times a day (QID) | ORAL | Status: DC
  Administered 2024-05-04 – 2024-05-05 (×3): 1000 mg via ORAL
  Filled 2024-05-04 (×4): qty 2

## 2024-05-04 MED ORDER — MIDAZOLAM HCL (PF) 2 MG/2ML IJ SOLN: 2.0000 mg | Freq: Once | INTRAMUSCULAR | Status: AC

## 2024-05-04 MED ORDER — OXYCODONE HCL 5 MG/5ML PO SOLN: 5.0000 mg | Freq: Once | ORAL | Status: DC | PRN

## 2024-05-04 MED ORDER — HYDROMORPHONE HCL 1 MG/ML IJ SOLN
1.0000 mg | Freq: Three times a day (TID) | INTRAMUSCULAR | Status: DC | PRN
  Administered 2024-05-04: 18:00:00 1 mg via INTRAVENOUS
  Filled 2024-05-04: qty 1

## 2024-05-04 MED ORDER — PHENOL 1.4 % MT LIQD
1.0000 | OROMUCOSAL | Status: DC | PRN
  Filled 2024-05-04: qty 177

## 2024-05-04 MED ORDER — ACETAMINOPHEN 325 MG PO TABS: 325.0000 mg | ORAL_TABLET | Freq: Four times a day (QID) | ORAL | Status: DC | PRN

## 2024-05-04 SURGICAL SUPPLY — 63 items
ALCOHOL 70% 16 OZ (MISCELLANEOUS) ×1 IMPLANT
BAG COUNTER SPONGE SURGICOUNT (BAG) ×1 IMPLANT
BAG DECANTER FOR FLEXI CONT (MISCELLANEOUS) ×1 IMPLANT
BLADE SAG 18X100X1.27 (BLADE) ×1 IMPLANT
BLADE SAW SAG 90X13X1.27 (BLADE) ×1 IMPLANT
BLADE SAW SGTL 73X25 THK (BLADE) ×1 IMPLANT
BNDG COMPR ESMARK 6X3 LF (GAUZE/BANDAGES/DRESSINGS) IMPLANT
BOWL SMART MIX CTS (DISPOSABLE) IMPLANT
CLSR STERI-STRIP ANTIMIC 1/2X4 (GAUZE/BANDAGES/DRESSINGS) IMPLANT
COMPONENT FEM KNEE STD PS 8 LT (Joint) IMPLANT
COMPONENT PATELLA PEG 3 32 (Joint) IMPLANT
COMPONENT TIB KNEE PS 0D LT (Joint) IMPLANT
COOLER ICEMAN CLASSIC (MISCELLANEOUS) ×1 IMPLANT
COVER SURGICAL LIGHT HANDLE (MISCELLANEOUS) ×1 IMPLANT
CUFF TOURN SGL QUICK 42 (TOURNIQUET CUFF) IMPLANT
CUFF TRNQT CYL 34X4.125X (TOURNIQUET CUFF) ×1 IMPLANT
DERMABOND ADVANCED .7 DNX12 (GAUZE/BANDAGES/DRESSINGS) ×1 IMPLANT
DRAPE EXTREMITY T 121X128X90 (DISPOSABLE) ×1 IMPLANT
DRAPE HALF SHEET 40X57 (DRAPES) ×1 IMPLANT
DRAPE INCISE IOBAN 66X45 STRL (DRAPES) ×1 IMPLANT
DRAPE POUCH INSTRU U-SHP 10X18 (DRAPES) ×1 IMPLANT
DRAPE SURG ORHT 6 SPLT 77X108 (DRAPES) IMPLANT
DRAPE U-SHAPE 47X51 STRL (DRAPES) ×2 IMPLANT
DRSG AQUACEL AG ADV 3.5X10 (GAUZE/BANDAGES/DRESSINGS) ×1 IMPLANT
DURAPREP 26ML APPLICATOR (WOUND CARE) ×3 IMPLANT
ELECT CAUTERY BLADE 6.4 (BLADE) ×1 IMPLANT
ELECT PENCIL ROCKER SW 15FT (MISCELLANEOUS) ×1 IMPLANT
ELECTRODE REM PT RTRN 9FT ADLT (ELECTROSURGICAL) ×1 IMPLANT
GLOVE BIOGEL PI IND STRL 7.5 (GLOVE) ×1 IMPLANT
GLOVE BIOGEL PI MICRO STRL 7 (GLOVE) ×5 IMPLANT
GLOVE INDICATOR 7.0 STRL GRN (GLOVE) ×1 IMPLANT
GLOVE SURG SYN 7.5 PF PI (GLOVE) ×5 IMPLANT
GOWN STRL REUS W/ TWL LRG LVL3 (GOWN DISPOSABLE) ×2 IMPLANT
GOWN TOGA ZIPPER T7+ PEEL AWAY (MISCELLANEOUS) ×1 IMPLANT
HOOD PEEL AWAY T7 (MISCELLANEOUS) ×1 IMPLANT
INSERT ASF POLY 14 LT (Insert) IMPLANT
IV 0.9% NACL 1000 ML (IV SOLUTION) ×1 IMPLANT
KIT BASIN OR (CUSTOM PROCEDURE TRAY) ×1 IMPLANT
KIT TURNOVER KIT B (KITS) ×1 IMPLANT
MANIFOLD NEPTUNE II (INSTRUMENTS) ×1 IMPLANT
MARKER SKIN DUAL TIP RULER LAB (MISCELLANEOUS) ×2 IMPLANT
NDL SPNL 18GX3.5 QUINCKE PK (NEEDLE) ×1 IMPLANT
PACK TOTAL JOINT (CUSTOM PROCEDURE TRAY) ×1 IMPLANT
PAD ARMBOARD POSITIONER FOAM (MISCELLANEOUS) ×2 IMPLANT
PAD COLD SHLDR WRAP-ON (PAD) ×1 IMPLANT
PIN DRILL HDLS TROCAR 75 4PK (PIN) IMPLANT
SCREW FEMALE HEX FIX 25X2.5 (ORTHOPEDIC DISPOSABLE SUPPLIES) IMPLANT
SET HNDPC FAN SPRY TIP SCT (DISPOSABLE) ×1 IMPLANT
SOLN 0.9% NACL POUR BTL 1000ML (IV SOLUTION) ×1 IMPLANT
SOLUTION PRONTOSAN WOUND 350ML (IRRIGATION / IRRIGATOR) ×1 IMPLANT
STAPLER SKIN PROX 35W (STAPLE) IMPLANT
SUCTION TUBE FRAZIER 10FR DISP (SUCTIONS) IMPLANT
SUT ETHILON 2 0 FS 18 (SUTURE) ×2 IMPLANT
SUT STRATAFIX PDS+ 0 24IN (SUTURE) ×1 IMPLANT
SUT VIC AB 0 CT1 27XBRD ANBCTR (SUTURE) ×1 IMPLANT
SUT VIC AB 1 CTX 27 (SUTURE) IMPLANT
SUT VIC AB 2-0 CT1 TAPERPNT 27 (SUTURE) ×3 IMPLANT
SYR 30ML LL (SYRINGE) ×2 IMPLANT
TOWEL GREEN STERILE (TOWEL DISPOSABLE) ×1 IMPLANT
TOWEL GREEN STERILE FF (TOWEL DISPOSABLE) ×1 IMPLANT
TUBE SUCT ARGYLE STRL (TUBING) ×1 IMPLANT
UNDERPAD 30X36 HEAVY ABSORB (UNDERPADS AND DIAPERS) ×1 IMPLANT
YANKAUER SUCT BULB TIP NO VENT (SUCTIONS) ×1 IMPLANT

## 2024-05-04 NOTE — H&P (Signed)
 PREOPERATIVE H&P  Chief Complaint: left knee osteoarthritis  HPI: Alex Holt is a 58 y.o. male who presents for surgical treatment of left knee osteoarthritis.  He denies any changes in medical history.  Past Surgical History:  Procedure Laterality Date   NO PAST SURGERIES     Social History   Socioeconomic History   Marital status: Married    Spouse name: Not on file   Number of children: 2   Years of education: Not on file   Highest education level: Not on file  Occupational History   Occupation: sELF EMPLOYED     Comment: [employer]   Tobacco Use   Smoking status: Never   Smokeless tobacco: Never  Vaping Use   Vaping status: Never Used  Substance and Sexual Activity   Alcohol use: Not Currently    Comment: Stopped drinked in Sept '24   Drug use: No   Sexual activity: Yes  Other Topics Concern   Not on file  Social History Narrative   Not on file   Social Drivers of Health   Tobacco Use: Low Risk (05/01/2024)   Patient History    Smoking Tobacco Use: Never    Smokeless Tobacco Use: Never    Passive Exposure: Not on file  Financial Resource Strain: Not on File (09/07/2021)   Received from [employer] [facility]    Financial Resource Strain: 0  Food Insecurity: Not at Risk (06/18/2023)   Received from [facility] Insecurity    Within the past 12 months, you worried that your food would run out before you got money to buy more.: 1  Transportation Needs: Not at Risk (06/18/2023)   Received from [facility] Needs    In the past 12 months, has lack of transportation kept you from medical appointments, meetings, work or from getting things needed for daily living?: 1  Physical Activity: Not on File (09/07/2021)   Received from [facility]   Physical Activity    Physical Activity: 0  Stress: Not on File (09/07/2021)   Received from [facility]   Stress    Stress: 0  Social Connections: Not on File (02/11/2023)   Received from  [facility]   Social Connections    Connectedness: 0  Depression (PHQ2-9): Not on file  Alcohol Screen: Not on file  Housing: Not on file  Utilities: Not on file  Health Literacy: Not on file   Family History  Problem Relation Age of Onset   Mental illness Mother    Mental illness Other        FAMILY HISTORY    Diabetes Other    Allergies[1] Prior to Admission medications  Medication Sig Start Date End Date Taking? Authorizing Provider  amLODipine  (NORVASC ) 10 MG tablet Take 1 tablet by mouth daily. 01/28/23   [provider]  atorvastatin  (LIPITOR) 20 MG tablet Take 1 tablet (20 mg total) by mouth at bedtime. For cholesterol 09/07/13   Mansfield, Theodoro FALCON, MD  cetirizine  (ZYRTEC  ALLERGY) 10 MG tablet Take 1 tablet (10 mg total) by mouth at bedtime. 02/05/22 04/23/24  Joesph Shaver Scales, PA-C  ciclopirox  (PENLAC ) 8 % solution Apply topically at bedtime. Apply over nail and surrounding skin. Apply daily over previous coat. After seven (7) days, may remove with alcohol and continue cycle. 04/23/24   Mayer, Jodi R, NP  docusate sodium  (COLACE) 100 MG capsule Take 1 capsule (100 mg total) by mouth daily as needed. 04/22/24  04/22/25  Jule Ronal CROME, PA-C  doxycycline  (VIBRAMYCIN ) 100 MG capsule Take 1 capsule (100 mg total) by mouth 2 (two) times daily. To be taken after surgery 04/22/24   Jule Ronal CROME, PA-C  fluticasone  (FLONASE ) 50 MCG/ACT nasal spray Place 1 spray into both nostrils daily. Begin by using 2 sprays in each nare daily for 3 to 5 days, then decrease to 1 spray in each nare daily. 02/05/22   Joesph Shaver Scales, PA-C  hydrochlorothiazide  (HYDRODIURIL ) 50 MG tablet Take 50 mg by mouth daily.    [provider]  IRON PO Take 45 mg by mouth.    [provider]  loratadine (CLARITIN) 10 MG tablet Take 10 mg by mouth daily as needed for allergies. 11/18/18   [provider]  meloxicam  (MOBIC ) 15 MG tablet Take 1 tablet (15 mg total) by mouth daily.  09/26/23   Emiliano Leonce CROME, PA-C  metFORMIN  (GLUCOPHAGE ) 500 MG tablet SMARTSIG:1 Tablet(s) By Mouth Morning-Evening 12/28/21   [provider]  metFORMIN  (GLUCOPHAGE ) 500 MG tablet Take by mouth 2 (two) times daily with a meal.    [provider]  methylPREDNISolone  (MEDROL  DOSEPAK) 4 MG TBPK tablet Take per packet instructions 04/05/23   Emiliano Leonce L, PA-C  ondansetron  (ZOFRAN ) 4 MG tablet Take 1 tablet (4 mg total) by mouth every 8 (eight) hours as needed for nausea or vomiting. 04/22/24   Jule Ronal CROME, PA-C  oxyCODONE -acetaminophen  (PERCOCET) 5-325 MG tablet Take 1-2 tablets by mouth every 6 (six) hours as needed. To be taken after surgery 04/22/24   Jule Ronal CROME, PA-C  triamcinolone  cream (KENALOG ) 0.5 % Apply 1 Application topically as needed (pain). 11/21/18   [provider]  valsartan (DIOVAN) 80 MG tablet Take 80 mg by mouth daily.    [provider]     Positive ROS: All other systems have been reviewed and were otherwise negative with the exception of those mentioned in the HPI and as above.  Physical Exam: General: Alert, no acute distress Cardiovascular: No pedal edema Respiratory: No cyanosis, no use of accessory musculature GI: abdomen soft Skin: No lesions in the area of chief complaint Neurologic: Sensation intact distally Psychiatric: Patient is competent for consent with normal mood and affect Lymphatic: no lymphedema  MUSCULOSKELETAL: exam stable  Assessment: left knee osteoarthritis  Plan: Plan for Procedures: ARTHROPLASTY, KNEE, TOTAL  The risks benefits and alternatives were discussed with the patient including but not limited to the risks of nonoperative treatment, versus surgical intervention including infection, bleeding, nerve injury,  blood clots, cardiopulmonary complications, morbidity, mortality, among others, and they were willing to proceed.   Ozell Cummins, MD 05/04/2024 6:14 AM     [1] No Known  Allergies

## 2024-05-04 NOTE — Anesthesia Procedure Notes (Signed)
 Anesthesia Regional Block: Adductor canal block   Pre-Anesthetic Checklist: , timeout performed,  Correct Patient, Correct Site, Correct Laterality,  Correct Procedure, Correct Position, site marked,  Risks and benefits discussed,  Surgical consent,  Pre-op evaluation,  At surgeon's request and post-op pain management  Laterality: Left and Lower  Prep: chloraprep       Needles:  Injection technique: Single-shot      Needle Length: 9cm  Needle Gauge: 22     Additional Needles: Arrow StimuQuik ECHO Echogenic Stimulating PNB Needle  Procedures:,,,, ultrasound used (permanent image in chart),,    Narrative:  Start time: 05/04/2024 8:41 AM End time: 05/04/2024 8:47 AM Injection made incrementally with aspirations every 5 mL.  Performed by: Personally  Anesthesiologist: Leopoldo Bruckner, MD

## 2024-05-04 NOTE — Transfer of Care (Signed)
 Immediate Anesthesia Transfer of Care Note  Patient: Alex Holt  Procedure(s) Performed: ARTHROPLASTY, KNEE, TOTAL (Left: Knee)  Patient Location: PACU  Anesthesia Type:Spinal  Level of Consciousness: awake, alert , and oriented  Airway & Oxygen Therapy: Patient Spontanous Breathing and Patient connected to nasal cannula oxygen  Post-op Assessment: Report given to RN and Post -op Vital signs reviewed and stable  Post vital signs: Reviewed and stable  Last Vitals:  Vitals Value Taken Time  BP 126/82 05/04/24 11:05  Temp    Pulse 82 05/04/24 11:08  Resp 18 05/04/24 11:08  SpO2 93 % 05/04/24 11:08  Vitals shown include unfiled device data.  Last Pain:  Vitals:   05/04/24 0848  TempSrc:   PainSc: 10-Worst pain ever      Patients Stated Pain Goal: 2 (05/04/24 0744)  Complications: No notable events documented.

## 2024-05-04 NOTE — Op Note (Signed)
 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 CPT [phone]  Surgeon: N. Ozell Cummins, MD  Assist: Eva Staple, RNFA  Anesthesia: Spinal, regional  Tourniquet time: see anesthesia record  Implants used: Zimmer persona press fit Femur: CR 8 Tibia: F Patella: 32 mm Polyethylene: 14 mm medial congruent  Indication: Alex Holt is a 58 y.o. year old male with a history of knee pain. Having failed conservative management, the patient elected to proceed with a total knee arthroplasty.  We have reviewed the risk and benefits of the surgery and they elected to proceed after voicing understanding.  Procedure:  After informed consent was obtained and understanding of the risk were voiced including but not limited to bleeding, infection, damage to surrounding structures including nerves and vessels, blood clots, leg length inequality and the failure to achieve desired results, the operative extremity was marked with verbal confirmation of the patient in the holding area.   The patient was then brought to the operating room and transported to the operating room table in the supine position.  A tourniquet was applied to the operative extremity around the upper thigh. The operative limb was then prepped and draped in the usual sterile fashion and preoperative antibiotics were administered.  A time out was performed prior to the start of surgery confirming the correct extremity, preoperative antibiotic administration, as well as team members, implants and instruments available for the case. Correct surgical site was also confirmed with preoperative radiographs. The limb was then elevated for exsanguination and the tourniquet was inflated. A midline  incision was made and a standard medial parapatellar approach was performed.  The patella was everted which showed complete loss of articular cartilage.  The patella was resected down to 14 mm and sized to a 32 mm.  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  A cover was placed on the patella for protection from retractors.  We then turned our attention to the femur.  The ACL was sacrificed. Start site was drilled in the femur and the intramedullary distal femoral cutting guide was placed, set at 5 degrees valgus, taking 10 mm of distal resection. The distal cut was made. Osteophytes were then removed.   Next, the proximal tibial cutting guide was placed with appropriate slope, varus/valgus alignment and depth of resection. A drop rod was attached to confirm that it was pointed to the second metatarsal.  The proximal tibial cut was made taking 4 mm off the low side.  There was a fair bit of sclerotic bone when I took just 2 mm off of the medial side.  Gap blocks were then used to assess the extension gap and alignment, and appropriate soft tissue releases were performed. Attention was turned back to the femur, which was sized using the sizing guide to a size 8. Appropriate rotation of the femoral component was determined using epicondylar axis, Whitesides line, and assessing the flexion gap under ligament tension. The appropriate size 4-in-1 cutting block was placed and cuts were made.  Posterior femoral osteophytes and uncapped bone were then removed with the curved osteotome.  Trial components were placed, and stability was checked in full extension, mid-flexion, and deep flexion.  The PCL was resected to balance the flexion space.  The patella tracked well without a lateral release. Trial components were then removed and tibial preparation performed.  The tibial trial was pointed to the medial third of the tibial tubercle.  The tibia was sized for a size F component and prepped.  Trial components were removed.   The bony  surfaces were irrigated with a pulse lavage and then dried. Final components were placed.  The final polyethylene liner, 14 mm thick, was inserted and checked to ensure the locking mechanism had engaged appropriately.  The stability of the construct was re-evaluated throughout a range of motion and found to be acceptable.  The tourniquet was deflated and hemostasis was achieved. The wound was irrigated with normal saline.  One gram of vancomycin  powder was placed in the surgical bed.  Topical mixture of 0.25% bupivacaine  and meloxicam  was placed in the joint for postoperative pain.  Capsular closure was performed with a #1 statafix in flexion, subcutaneous fat closed with a 0 vicryl suture, then subcutaneous tissue closed with interrupted 2.0 vicryl suture. The skin was then closed with a 2.0 nylon and dermabond. A sterile dressing was applied.  The patient was awakened in the operating room and taken to recovery in stable condition. All sponge, needle, and instrument counts were correct at the end of the case.  Alex Holt was necessary for opening, closing, retracting, limb positioning and overall facilitation and completion of the surgery.  Position: supine  Complications: none.  Time Out: performed  Drains/Packing: none Estimated blood loss: minimal Returned to Recovery Room: in good condition.   Mechanical VTE (DVT) Prophylaxis: sequential compression devices, TED thigh-high  Chemical VTE (DVT) Prophylaxis: eliquis  POD 1  Fluid Replacement  Crystalloid: see anesthesia record Blood: none  FFP: none   Specimens Removed: 1 to pathology  Sponge and Instrument Count Correct? yes  PACU: portable radiograph - knee AP and Lateral  Plan/RTC: Return in 2 weeks for suture removal.  Weight Bearing/Load Lower Extremity: full   Implant Name Type Inv. Item Serial Holt. Manufacturer Lot Holt. LRB Holt. Used Action  COMPONENT PATELLA PEG 3 32 - [id number] Joint COMPONENT PATELLA PEG 3 32  ZIMMER  RECON(ORTH,TRAU,BIO,SG) [id number] Left 1 Implanted  COMPONENT FEM KNEE STD PS 8 LT - [id number] Joint COMPONENT FEM KNEE STD PS 8 LT  ZIMMER RECON(ORTH,TRAU,BIO,SG) [id number] Left 1 Implanted  COMPONENT TIB KNEE PS 0D LT - [id number] Joint COMPONENT TIB KNEE PS 0D LT  ZIMMER RECON(ORTH,TRAU,BIO,SG) [id number] Left 1 Implanted  INSERT ASF POLY 14 LT - [id number] Insert INSERT ASF POLY 14 LT  ZIMMER RECON(ORTH,TRAU,BIO,SG) [id number] Left 1 Implanted    N. Ozell Cummins, MD [facility] 10:17 AM

## 2024-05-04 NOTE — Anesthesia Preprocedure Evaluation (Signed)
 Anesthesia Evaluation  Patient identified by MRN, date of birth, ID band Patient awake    Reviewed: Allergy & Precautions, NPO status , Patient's Chart, lab work & pertinent test results  History of Anesthesia Complications Negative for: history of anesthetic complications  Airway Mallampati: I  TM Distance: >3 FB Neck ROM: Full    Dental  (+) Teeth Intact, Dental Advisory Given   Pulmonary neg shortness of breath, neg sleep apnea, neg COPD, neg recent URI, Patient abstained from smoking.   breath sounds clear to auscultation       Cardiovascular hypertension, Pt. on medications (-) angina (-) Past MI and (-) CHF  Rhythm:Regular     Neuro/Psych neg Seizures    GI/Hepatic Neg liver ROS,GERD  Controlled,,  Endo/Other  diabetes, Type 2, Oral Hypoglycemic Agents    Renal/GU negative Renal ROSLab Results      Component                Value               Date                      NA                       141                 12/14/2013                K                        4.1                 12/14/2013                CO2                      28                  12/14/2013                GLUCOSE                  91                  12/14/2013                BUN                      17                  12/14/2013                CREATININE               1.18                12/14/2013                CALCIUM                   10.1                12/14/2013                Musculoskeletal  (+) Arthritis ,    Abdominal   Peds  Hematology  (+) Blood dyscrasia, anemia Lab Results  Component                Value               Date                      WBC                      7.1                 05/04/2024                HGB                      11.4 (L)            05/04/2024                HCT                      36.3 (L)            05/04/2024                MCV                      69.0 (L)            05/04/2024                 PLT                      459 (H)             05/04/2024              Anesthesia Other Findings   Reproductive/Obstetrics                              Anesthesia Physical Anesthesia Plan  ASA: 2  Anesthesia Plan: MAC, Regional and Spinal   Post-op Pain Management: Regional block*   Induction: Intravenous  PONV Risk Score and Plan: 1 and Propofol  infusion, Treatment may vary due to age or medical condition and Ondansetron   Airway Management Planned: Natural Airway, Nasal Cannula and Simple Face Mask  Additional Equipment: None  Intra-op Plan:   Post-operative Plan:   Informed Consent: I have reviewed the patients History and Physical, chart, labs and discussed the procedure including the risks, benefits and alternatives for the proposed anesthesia with the patient or authorized representative who has indicated his/her understanding and acceptance.     Dental advisory given  Plan Discussed with: CRNA  Anesthesia Plan Comments:          Anesthesia Quick Evaluation

## 2024-05-04 NOTE — Progress Notes (Signed)
 Orthopedic Tech Progress Note Patient Details:  Alex Holt 1966/03/18 [id number]  Ortho Devices Type of Ortho Device: Bone foam zero knee Ortho Device/Splint Location: LLE Ortho Device/Splint Interventions: Ordered, Application, Adjustment   Post Interventions Patient Tolerated: Well Instructions Provided: Care of device  [name] Pac 05/04/2024, 11:30 AM

## 2024-05-04 NOTE — Anesthesia Procedure Notes (Signed)
 Spinal  Patient location during procedure: OR Start time: 05/04/2024 9:05 AM End time: 05/04/2024 9:09 AM Reason for block: surgical anesthesia  Staffing Performed: anesthesiologist  Authorized by: Leopoldo Bruckner, MD   Performed by: Leopoldo Bruckner, MD  Preanesthetic Checklist Completed: patient identified, IV checked, risks and benefits discussed, surgical consent, monitors and equipment checked, pre-op evaluation and timeout performed Spinal Block Patient position: sitting Prep: DuraPrep Patient monitoring: heart rate, cardiac monitor, continuous pulse ox and blood pressure Approach: midline Location: L4-5 Injection technique: single-shot Needle Needle type: Pencan  Needle gauge: 24 G Needle length: 9 cm Assessment Events: CSF return

## 2024-05-04 NOTE — Evaluation (Signed)
 Physical Therapy Evaluation Patient Details Name: Alex Holt MRN: [id number] DOB: Jan 23, 1966 Today's Date: 05/04/2024  History of Present Illness  58 y.o. male presents to [facility] hospital on 05/04/2024 for elective L TKA. PMH includes HLD, anemia, HTN, GERD, DMII  Clinical Impression  Pt presents to PT with deficits in functional mobility, gait, balance, strength, ROM. Pt is able to ambulate for household distances with support of the RW. PT provides education on the TKR exercise packet and encourages frequent mobilization with staff assistance. PT will follow up tomorrow for a progression of gait and to initiate stair training.        If plan is discharge home, recommend the following: A little help with walking and/or transfers;A little help with bathing/dressing/bathroom;Assistance with cooking/housework;Assist for transportation;Help with stairs or ramp for entrance   Can travel by private vehicle        Equipment Recommendations BSC/3in1  Recommendations for Other Services       Functional Status Assessment Patient has had a recent decline in their functional status and demonstrates the ability to make significant improvements in function in a reasonable and predictable amount of time.     Precautions / Restrictions Precautions Precautions: Fall;Knee Precaution Booklet Issued: Yes (comment) Recall of Precautions/Restrictions: Intact Restrictions Weight Bearing Restrictions Per Provider Order: Yes LLE Weight Bearing Per Provider Order: Weight bearing as tolerated      Mobility  Bed Mobility Overal bed mobility: Needs Assistance Bed Mobility: Supine to Sit, Sit to Supine     Supine to sit: Supervision Sit to supine: Contact guard assist        Transfers Overall transfer level: Needs assistance Equipment used: Rolling walker (2 wheels) Transfers: Sit to/from Stand Sit to Stand: Contact guard assist                Ambulation/Gait Ambulation/Gait  assistance: Contact guard assist Gait Distance (Feet): 150 Feet Assistive device: Rolling walker (2 wheels) Gait Pattern/deviations: Step-through pattern, Trunk flexed Gait velocity: reduced Gait velocity interpretation: <1.8 ft/sec, indicate of risk for recurrent falls   General Gait Details: slowed step-through gait, verbal cues to increase trunk and hip extension as pt tends to lean over RW for support  Stairs            Wheelchair Mobility     Tilt Bed    Modified Rankin (Stroke Patients Only)       Balance Overall balance assessment: Needs assistance Sitting-balance support: No upper extremity supported, Feet supported Sitting balance-Leahy Scale: Good     Standing balance support: Single extremity supported, Reliant on assistive device for balance Standing balance-Leahy Scale: Poor                               Pertinent Vitals/Pain Pain Assessment Pain Assessment: 0-10 Pain Score: 10-Worst pain ever Pain Location: L knee Pain Descriptors / Indicators: Aching Pain Intervention(s): Premedicated before session    Home Living Family/patient expects to be discharged to:: Private residence Living Arrangements: Spouse/significant other Available Help at Discharge: Family;Available 24 hours/day Type of Home: House Home Access: Stairs to enter Entrance Stairs-Rails: Can reach both Entrance Stairs-Number of Steps: 3   Home Layout: One level Home Equipment: Agricultural Consultant (2 wheels);Rollator (4 wheels)      Prior Function Prior Level of Function : Independent/Modified Independent;Driving             Mobility Comments: ambulatory without DME  Extremity/Trunk Assessment   Upper Extremity Assessment Upper Extremity Assessment: Overall WFL for tasks assessed    Lower Extremity Assessment Lower Extremity Assessment: LLE deficits/detail LLE Deficits / Details: generalized post-op weakness and ROM deficits as anticipated on POD 0     Cervical / Trunk Assessment Cervical / Trunk Assessment: Normal  Communication   Communication Communication: No apparent difficulties    Cognition Arousal: Alert Behavior During Therapy: WFL for tasks assessed/performed   PT - Cognitive impairments: No apparent impairments                         Following commands: Intact       Cueing Cueing Techniques: Verbal cues     General Comments General comments (skin integrity, edema, etc.): VSS on RA    Exercises Other Exercises Other Exercises: PT provides education on TKR exercise packet   Assessment/Plan    PT Assessment Patient needs continued PT services  PT Problem List Decreased strength;Decreased range of motion;Decreased balance;Decreased activity tolerance;Decreased mobility;Decreased knowledge of use of DME;Pain       PT Treatment Interventions DME instruction;Gait training;Stair training;Functional mobility training;Therapeutic activities;Therapeutic exercise;Balance training;Neuromuscular re-education;Patient/family education    PT Goals (Current goals can be found in the Care Plan section)  Acute Rehab PT Goals Patient Stated Goal: to return to independence PT Goal Formulation: With patient/family Time For Goal Achievement: 05/08/24 Potential to Achieve Goals: Good    Frequency 7X/week     Co-evaluation               AM-PAC PT 6 Clicks Mobility  Outcome Measure Help needed turning from your back to your side while in a flat bed without using bedrails?: A Little Help needed moving from lying on your back to sitting on the side of a flat bed without using bedrails?: A Little Help needed moving to and from a bed to a chair (including a wheelchair)?: A Little Help needed standing up from a chair using your arms (e.g., wheelchair or bedside chair)?: A Little Help needed to walk in hospital room?: A Little Help needed climbing 3-5 steps with a railing? : A Lot 6 Click Score: 17    End  of Session Equipment Utilized During Treatment: Gait belt Activity Tolerance: Patient tolerated treatment well Patient left: in bed;with call bell/phone within reach;with family/visitor present Nurse Communication: Mobility status PT Visit Diagnosis: Other abnormalities of gait and mobility (R26.89);Muscle weakness (generalized) (M62.81);Pain Pain - Right/Left: Left Pain - part of body: Knee    Time: 8452-8393 PT Time Calculation (min) (ACUTE ONLY): 19 min   Charges:   PT Evaluation $PT Eval Low Complexity: 1 Low   PT General Charges $$ ACUTE PT VISIT: 1 Visit         Bernardino JINNY Ruth, PT, DPT Acute Rehabilitation Office [phone]   Bernardino JINNY Ruth 05/04/2024, 5:31 PM

## 2024-05-04 NOTE — Discharge Instructions (Addendum)

## 2024-05-04 NOTE — Progress Notes (Signed)
°   05/04/24 0743  OBSTRUCTIVE SLEEP APNEA  Have you ever been diagnosed with sleep apnea through a sleep study? No  Do you snore loudly (loud enough to be heard through closed doors)?  1  Do you often feel tired, fatigued, or sleepy during the daytime (such as falling asleep during driving or talking to someone)? 0  Has anyone observed you stop breathing during your sleep? 1  Do you have, or are you being treated for high blood pressure? 1  BMI more than 35 kg/m2? 1  Age > 50 (1-yes) 1  Neck circumference greater than:Male 16 inches or larger, Male 17inches or larger? 0  Male Gender (Yes=1) 1  Obstructive Sleep Apnea Score 6  Score 5 or greater  Results sent to PCP

## 2024-05-05 ENCOUNTER — Other Ambulatory Visit (HOSPITAL_COMMUNITY): Payer: Self-pay

## 2024-05-05 ENCOUNTER — Ambulatory Visit: Attending: Orthopaedic Surgery

## 2024-05-05 ENCOUNTER — Encounter (HOSPITAL_COMMUNITY): Payer: Self-pay | Admitting: Orthopaedic Surgery

## 2024-05-05 DIAGNOSIS — M1712 Unilateral primary osteoarthritis, left knee: Secondary | ICD-10-CM | POA: Diagnosis not present

## 2024-05-05 LAB — GLUCOSE, CAPILLARY: Glucose-Capillary: 125 mg/dL — ABNORMAL HIGH (ref 70–99)

## 2024-05-05 NOTE — Progress Notes (Signed)
 Patient alert and oriented, voided, ambulate. Surgical site clean and dry no sign of infection. D/c instructions explain and given to the patient all questions answered. Patient d/c home with RW. Per order.

## 2024-05-05 NOTE — Evaluation (Signed)
 Occupational Therapy Evaluation Patient Details Name: Alex Holt MRN: [id number] DOB: 16-Apr-1966 Today's Date: 05/05/2024   History of Present Illness   58 y.o. male presents to [facility] hospital on 05/04/2024 for elective L TKA. PMH includes HLD, anemia, HTN, GERD, DMII     Clinical Impressions PTA Patient independent. Admitted for above and presents with problem list below.  Pt s/p L TKA, needing assist for L LE ADLs (dressing/bathing) but reports spouse will assist at home. Educated on safety with RW for transfers/mobility, needing supervision at this time.  He simulated tub transfers, needing contact guard assist but has grab bar and BSC to use in shower with plans to have spouse assist him.  Pt educated on knee precautions and safety.  No further OT needs identified at this time and OT will sign off.  Thank you for this referral.      If plan is discharge home, recommend the following:   A little help with walking and/or transfers;A little help with bathing/dressing/bathroom;Assistance with cooking/housework;Assist for transportation;Help with stairs or ramp for entrance     Functional Status Assessment   Patient has had a recent decline in their functional status and demonstrates the ability to make significant improvements in function in a reasonable and predictable amount of time.     Equipment Recommendations   None recommended by OT     Recommendations for Other Services         Precautions/Restrictions   Precautions Precautions: Fall;Knee Recall of Precautions/Restrictions: Intact Restrictions Weight Bearing Restrictions Per Provider Order: Yes LLE Weight Bearing Per Provider Order: Weight bearing as tolerated     Mobility Bed Mobility Overal bed mobility: Needs Assistance Bed Mobility: Supine to Sit, Sit to Supine     Supine to sit: Supervision Sit to supine: Supervision        Transfers Overall transfer level: Needs assistance Equipment used:  Rolling walker (2 wheels) Transfers: Sit to/from Stand Sit to Stand: Supervision           General transfer comment: standing without UE support to RW, supervision for safety      Balance Overall balance assessment: Needs assistance Sitting-balance support: No upper extremity supported, Feet supported Sitting balance-Leahy Scale: Good     Standing balance support: Bilateral upper extremity supported, No upper extremity supported, During functional activity Standing balance-Leahy Scale: Fair Standing balance comment: statically able to standing without UE support but dynamically relies on RW                           ADL either performed or assessed with clinical judgement   ADL Overall ADL's : Needs assistance/impaired     Grooming: Supervision/safety;Wash/dry hands;Standing           Upper Body Dressing : Set up;Sitting   Lower Body Dressing: Minimal assistance;Sit to/from stand   Toilet Transfer: Supervision/safety;Ambulation;Rolling walker (2 wheels)       Tub/ Shower Transfer: Contact guard assist;Ambulation;BSC/3in1;Rolling walker (2 wheels);Grab bars Tub/Shower Transfer Details (indicate cue type and reason): simulated technique into tub with contact guard assist, pt with limited carryover of techniques and reports he will get his wife to help him and he did it before with knee pain so he'll be fine.  Pt educated on use of RW to increase support and safety to step over threshold. Functional mobility during ADLs: Supervision/safety;Rolling walker (2 wheels) General ADL Comments: cueing for safety and utilizing RW for mobility     Vision  Vision Assessment?: No apparent visual deficits     Perception         Praxis         Pertinent Vitals/Pain Pain Assessment Pain Assessment: Faces Faces Pain Scale: Hurts little more Pain Location: L knee Pain Descriptors / Indicators: Aching, Operative site guarding Pain Intervention(s): Limited  activity within patient's tolerance, Monitored during session, Repositioned     Extremity/Trunk Assessment Upper Extremity Assessment Upper Extremity Assessment: Overall WFL for tasks assessed   Lower Extremity Assessment Lower Extremity Assessment: Defer to PT evaluation (s/p L TKA)   Cervical / Trunk Assessment Cervical / Trunk Assessment: Normal   Communication Communication Communication: No apparent difficulties   Cognition Arousal: Alert Behavior During Therapy: WFL for tasks assessed/performed Cognition: No apparent impairments                               Following commands: Intact       Cueing  General Comments   Cueing Techniques: Verbal cues  educated on not placing pillow under knee   Exercises     Shoulder Instructions      Home Living Family/patient expects to be discharged to:: Private residence Living Arrangements: Spouse/significant other Available Help at Discharge: Family;Available 24 hours/day Type of Home: House Home Access: Stairs to enter [employer] of Steps: 3 Entrance Stairs-Rails: Can reach both Home Layout: One level     Bathroom Shower/Tub: Chief Strategy Officer: Standard     Home Equipment: Agricultural Consultant (2 wheels);Rollator (4 wheels);BSC/3in1          Prior Functioning/Environment Prior Level of Function : Independent/Modified Independent;Driving             Mobility Comments: ambulatory without DME ADLs Comments: manages ADLs    OT Problem List: Decreased activity tolerance;Pain;Decreased knowledge of precautions;Decreased knowledge of use of DME or AE;Impaired balance (sitting and/or standing)   OT Treatment/Interventions:        OT Goals(Current goals can be found in the care plan section)   Acute Rehab OT Goals Patient Stated Goal: home OT Goal Formulation: With patient   OT Frequency:       Co-evaluation              AM-PAC OT 6 Clicks Daily Activity      Outcome Measure Help from another person eating meals?: None Help from another person taking care of personal grooming?: A Little Help from another person toileting, which includes using toliet, bedpan, or urinal?: A Little Help from another person bathing (including washing, rinsing, drying)?: A Little Help from another person to put on and taking off regular upper body clothing?: A Little Help from another person to put on and taking off regular lower body clothing?: A Little 6 Click Score: 19   End of Session Equipment Utilized During Treatment: Rolling walker (2 wheels) Nurse Communication: Mobility status;Precautions  Activity Tolerance: Patient tolerated treatment well Patient left: in bed;with call bell/phone within reach  OT Visit Diagnosis: Pain;Other abnormalities of gait and mobility (R26.89) Pain - Right/Left: Left Pain - part of body: Knee                Time: 9164-9152 OT Time Calculation (min): 12 min Charges:  OT General Charges $OT Visit: 1 Visit OT Evaluation $OT Eval Low Complexity: 1 Low  Etta NOVAK, OT Acute Rehabilitation Services Office [phone] Secure Chat Preferred    Etta GORMAN Hope 05/05/2024, 8:56  AM

## 2024-05-05 NOTE — Progress Notes (Signed)
 Unable to arrange [facility] due to insurance. Per bedside RN, Morna PA is in agreement with outpt therapy. Outpt therapy referral sent and information on the AVS.

## 2024-05-05 NOTE — Discharge Summary (Signed)
 Patient ID: Alex Holt MRN: [id number] DOB/AGE: [birth year] 58 y.o.  Admit date: 05/04/2024 Discharge date: 05/05/2024  Admission Diagnoses:  Principal Problem:   Primary osteoarthritis of left knee Active Problems:   Status post total left knee replacement   Discharge Diagnoses:  Same  Past Medical History:  Diagnosis Date   Acid reflux    Arthritis    Diabetes mellitus without complication (HCC)    Hyperlipidemia    Hypertension    Schizophrenia (HCC)     Surgeries: Procedures: ARTHROPLASTY, KNEE, TOTAL on 05/04/2024   Consultants:   Discharged Condition: Improved  Hospital Course: Alex Holt is an 58 y.o. male who was admitted 05/04/2024 for operative treatment ofPrimary osteoarthritis of left knee. Patient has severe unremitting pain that affects sleep, daily activities, and work/hobbies. After pre-op clearance the patient was taken to the operating room on 05/04/2024 and underwent  Procedures: ARTHROPLASTY, KNEE, TOTAL.    Patient was given perioperative antibiotics:  Anti-infectives (From admission, onward)    Start     Dose/Rate Route Frequency Ordered Stop   05/04/24 2200  doxycycline  (VIBRA -TABS) tablet 100 mg       Note to Pharmacy: To be taken after surgery     100 mg Oral 2 times daily 05/04/24 1359     05/04/24 1430  ceFAZolin  (ANCEF ) IVPB 2g/100 mL premix        2 g 200 mL/hr over 30 Minutes Intravenous Every 6 hours 05/04/24 1359 05/04/24 2151   05/04/24 0955  vancomycin  (VANCOCIN ) powder  Status:  Discontinued          As needed 05/04/24 0956 05/04/24 1102   05/04/24 0730  ceFAZolin  (ANCEF ) IVPB 2g/100 mL premix        2 g 200 mL/hr over 30 Minutes Intravenous On call to O.R. 05/04/24 0726 05/04/24 0909        Patient was given sequential compression devices, early ambulation, and chemoprophylaxis to prevent DVT.  Inpatient Morphine Milligram Equivalents Per Day 12/15 - 12/16   Values displayed are in units of MME/Day    Order Start /  End Date Yesterday Today    oxyCODONE  (Oxy IR/ROXICODONE ) immediate release tablet 5 mg 12/15 - 12/15 0 of Unknown --    oxyCODONE  (ROXICODONE ) 5 MG/5ML solution 5 mg 12/15 - 12/15 0 of Unknown --      Group total: 0 of Unknown     oxyCODONE  (Oxy IR/ROXICODONE ) immediate release tablet 5 mg 12/15 - No end date 0 of 15 0 of 30    oxyCODONE  (Oxy IR/ROXICODONE ) immediate release tablet 10 mg 12/15 - No end date 30 of 30 15 of 60    HYDROmorphone  (DILAUDID ) injection 1 mg 12/15 - No end date 20 of 40 0 of 60    fentaNYL  (SUBLIMAZE ) injection 25-50 mcg 12/15 - 12/15 0 of 45-90 --    fentaNYL  (SUBLIMAZE ) 100 MCG/2ML injection 12/15 - 12/15 0 of Unknown --    fentaNYL  (SUBLIMAZE ) injection 100 mcg 12/15 - 12/15 30 of 30 --    Daily Totals  80 of Unknown (at least 160-205) 15 of 150    Calculation Errors     Order Type Date Details   oxyCODONE  (Oxy IR/ROXICODONE ) immediate release tablet 5 mg Ordered Dose -- Insufficient frequency information   oxyCODONE  (ROXICODONE ) 5 MG/5ML solution 5 mg Ordered Dose -- Insufficient frequency information   fentaNYL  (SUBLIMAZE ) 100 MCG/2ML injection Ordered Dose -- Frequency type could not be determined  Patient benefited maximally from hospital stay and there were no complications.    Recent vital signs: Patient Vitals for the past 24 hrs:  BP Temp Temp src Pulse Resp SpO2  05/05/24 0759 (!) 161/100 100.2 F (37.9 C) Oral (!) 110 20 99 %  05/05/24 0330 (!) 147/90 99.2 F (37.3 C) Oral 88 18 100 %  05/04/24 2252 (!) 153/80 99.1 F (37.3 C) Oral (!) 102 18 100 %  05/04/24 1928 (!) 169/96 99.5 F (37.5 C) Oral (!) 101 18 98 %  05/04/24 1634 (!) 175/100 97.9 F (36.6 C) Oral 93 20 100 %  05/04/24 1415 (!) 153/95 98 F (36.7 C) -- 75 19 100 %  05/04/24 1400 139/88 97.7 F (36.5 C) -- 64 20 98 %  05/04/24 1345 (!) 109/92 -- -- 76 19 99 %  05/04/24 1330 (!) 141/82 -- -- 66 16 100 %  05/04/24 1300 (!) 145/78 -- -- 64 15 100 %  05/04/24 1245  (!) 148/98 -- -- (!) 58 15 99 %  05/04/24 1230 (!) 156/90 -- -- 63 16 100 %  05/04/24 1200 (!) 143/64 -- -- 66 17 98 %  05/04/24 1145 (!) 141/97 -- -- 71 13 100 %  05/04/24 1130 (!) 146/90 -- -- 76 14 98 %  05/04/24 1115 (!) 138/93 (!) 96.4 F (35.8 C) -- 74 17 99 %  05/04/24 1108 126/82 -- -- 82 18 93 %  05/04/24 0854 (!) 153/90 -- -- 93 17 99 %  05/04/24 0849 (!) 189/109 -- -- 90 18 100 %  05/04/24 0804 -- -- -- 89 15 97 %     Recent laboratory studies:  Recent Labs    05/04/24 0738  WBC 7.1  HGB 11.4*  HCT 36.3*  PLT 459*  NA 139  K 3.8  CL 103  CO2 23  BUN 18  CREATININE 1.22  GLUCOSE 92  CALCIUM  9.6     Discharge Medications:   Allergies as of 05/05/2024   No Known Allergies      Medication List     TAKE these medications    amLODipine  10 MG tablet Commonly known as: NORVASC  Take 1 tablet by mouth daily.   apixaban  2.5 MG Tabs tablet Commonly known as: Eliquis  Take one tablet by mouth twice daily for 30 days after surgery to prevent blood clots   atorvastatin  20 MG tablet Commonly known as: LIPITOR Take 1 tablet (20 mg total) by mouth at bedtime. For cholesterol What changed:  when to take this additional instructions   cetirizine  10 MG tablet Commonly known as: ZyrTEC  Allergy Take 1 tablet (10 mg total) by mouth at bedtime. What changed: when to take this   ciclopirox  8 % solution Commonly known as: PENLAC  Apply topically at bedtime. Apply over nail and surrounding skin. Apply daily over previous coat. After seven (7) days, may remove with alcohol and continue cycle.   docusate sodium  100 MG capsule Commonly known as: Colace Take 1 capsule (100 mg total) by mouth daily as needed.   doxycycline  100 MG capsule Commonly known as: VIBRAMYCIN  Take 1 capsule (100 mg total) by mouth 2 (two) times daily. To be taken after surgery   fluticasone  50 MCG/ACT nasal spray Commonly known as: FLONASE  Place 1 spray into both nostrils daily. Begin by  using 2 sprays in each nare daily for 3 to 5 days, then decrease to 1 spray in each nare daily. What changed:  how much to take when to take  this reasons to take this additional instructions   hydrochlorothiazide  50 MG tablet Commonly known as: HYDRODIURIL  Take 50 mg by mouth daily.   metFORMIN  500 MG tablet Commonly known as: GLUCOPHAGE  Take 500 mg by mouth 2 (two) times daily.   ondansetron  4 MG tablet Commonly known as: Zofran  Take 1 tablet (4 mg total) by mouth every 8 (eight) hours as needed for nausea or vomiting.   oxyCODONE -acetaminophen  5-325 MG tablet Commonly known as: Percocet Take 1-2 tablets by mouth every 6 (six) hours as needed. To be taken after surgery   Ozempic (0.25 or 0.5 MG/DOSE) 2 MG/3ML Sopn Generic drug: Semaglutide(0.25 or 0.5MG /DOS) Inject 0.5 mg into the skin once a week.   valsartan 80 MG tablet Commonly known as: DIOVAN Take 80 mg by mouth daily.               Durable Medical Equipment  (From admission, onward)           Start     Ordered   05/04/24 1359  DME Walker rolling  Once       Question Answer Comment  Walker: With 5 Inch Wheels   Patient needs a walker to treat with the following condition Status post left partial knee replacement      05/04/24 1359   05/04/24 1359  DME 3 n 1  Once        05/04/24 1359   05/04/24 1359  DME Bedside commode  Once       Question:  Patient needs a bedside commode to treat with the following condition  Answer:  Status post left partial knee replacement   05/04/24 1359            Diagnostic Studies: DG Knee Left Port Result Date: 05/04/2024 CLINICAL DATA:  Pain, postop. EXAM: PORTABLE LEFT KNEE - 1-2 VIEW COMPARISON:  None Available. FINDINGS: Left knee arthroplasty in expected alignment. No periprosthetic lucency or fracture. Recent postsurgical change includes air and edema in the soft tissues and joint space. IMPRESSION: Left knee arthroplasty without immediate postoperative  complication. Electronically Signed   By: Andrea Gasman M.D.   On: 05/04/2024 11:51   DG Foot Complete Right Result Date: 04/23/2024 Please see detailed radiograph report in office note.  DG Foot Complete Left Result Date: 04/23/2024 Please see detailed radiograph report in office note.  XR KNEE 3 VIEW LEFT Result Date: 04/15/2024 X-rays of the left knee show advanced tricompartmental osteoarthritis.  Bone-on-bone joint space narrowing.  Kellgren-Lawrence stage IV   Disposition: Discharge disposition: 01-Home or Self Care          Follow-up Information     Jule Ronal CROME, PA-C. Schedule an appointment as soon as possible for a visit in 2 week(s).   Specialty: Orthopedic Surgery Contact information: [address] [place]  [place] [address] [phone]                  Signed: Ronal CROME Jule 05/05/2024, 8:02 AM

## 2024-05-05 NOTE — Progress Notes (Signed)
 Physical Therapy Treatment  Patient Details Name: Alex Holt MRN: [id number] DOB: 06-Mar-1966 Today's Date: 05/05/2024   History of Present Illness 58 y.o. male presents to [facility] on 05/04/2024 for elective L TKA. PMH includes anemia, HTN, DMII    PT Comments  Pt progressing towards physical therapy goals. At the time of PT eval, pt was able to perform transfers and ambulation with gross CGA to supervision for safety Reviewed HEP and pt demonstrated understanding of technique. Pt also educated on precautions, appropriate activity progression, and car transfer. Pt anticipates d/c home this morning.    If plan is discharge home, recommend the following: A little help with walking and/or transfers;A little help with bathing/dressing/bathroom;Assistance with cooking/housework;Assist for transportation;Help with stairs or ramp for entrance   Can travel by private vehicle        Equipment Recommendations  BSC/3in1    Recommendations for Other Services       Precautions / Restrictions Precautions Precautions: Fall;Knee Precaution Booklet Issued: Yes (comment) Recall of Precautions/Restrictions: Intact Precaution/Restrictions Comments: Pt was educated that NO pillow/roll/ice pack should be propped under knee and that knee should be resting in extension at all times. Restrictions Weight Bearing Restrictions Per Provider Order: Yes LLE Weight Bearing Per Provider Order: Weight bearing as tolerated     Mobility  Bed Mobility               General bed mobility comments: Pt was received sitting up EOB    Transfers Overall transfer level: Needs assistance Equipment used: Rolling walker (2 wheels) Transfers: Sit to/from Stand Sit to Stand: Supervision           General transfer comment: VC's for hand placement on seated surface for safety.    Ambulation/Gait Ambulation/Gait assistance: Contact guard assist, Supervision Gait Distance (Feet): 200 Feet Assistive  device: Rolling walker (2 wheels) Gait Pattern/deviations: Step-through pattern, Trunk flexed, Decreased stride length, Decreased dorsiflexion - left Gait velocity: Decreased Gait velocity interpretation: <1.31 ft/sec, indicative of household ambulator   General Gait Details: Slow and guarded but generally steady with RW for support. VC's for sequencing with RW and increased heel strike.   Stairs Stairs:  (Pt declined stair training.)           Wheelchair Mobility     Tilt Bed    Modified Rankin (Stroke Patients Only)       Balance Overall balance assessment: Needs assistance Sitting-balance support: No upper extremity supported, Feet supported Sitting balance-Leahy Scale: Good     Standing balance support: Bilateral upper extremity supported, No upper extremity supported, During functional activity Standing balance-Leahy Scale: Fair Standing balance comment: statically able to standing without UE support but dynamically relies on RW                            Communication Communication Communication: No apparent difficulties  Cognition Arousal: Alert Behavior During Therapy: WFL for tasks assessed/performed   PT - Cognitive impairments: No apparent impairments                         Following commands: Intact      Cueing Cueing Techniques: Verbal cues  Exercises Total Joint Exercises Ankle Circles/Pumps: 10 reps, Both, AROM Quad Sets: 5 reps, Left, AROM Gluteal Sets: 5 reps, Left, AROM Towel Squeeze: 5 reps, Left, AROM Short Arc Quad: 5 reps, Left, AAROM Heel Slides: 10 reps, Left, AAROM Hip ABduction/ADduction: 5 reps,  Left, AAROM Straight Leg Raises: 5 reps, Left, AAROM Long Arc Quad: 5 reps, Left, AAROM Knee Flexion: 5 reps, Right, AAROM Goniometric ROM: 7-72    General Comments        Pertinent Vitals/Pain Pain Assessment Pain Assessment: Faces Faces Pain Scale: Hurts little more Pain Location: L knee Pain  Descriptors / Indicators: Aching, Operative site guarding Pain Intervention(s): Limited activity within patient's tolerance, Monitored during session, Repositioned    Home Living                          Prior Function            PT Goals (current goals can now be found in the care plan section) Acute Rehab PT Goals Patient Stated Goal: to return to independence PT Goal Formulation: With patient/family Time For Goal Achievement: 05/08/24 Potential to Achieve Goals: Good Progress towards PT goals: Progressing toward goals    Frequency    7X/week      PT Plan      Co-evaluation              AM-PAC PT 6 Clicks Mobility   Outcome Measure  Help needed turning from your back to your side while in a flat bed without using bedrails?: A Little Help needed moving from lying on your back to sitting on the side of a flat bed without using bedrails?: A Little Help needed moving to and from a bed to a chair (including a wheelchair)?: A Little Help needed standing up from a chair using your arms (e.g., wheelchair or bedside chair)?: A Little Help needed to walk in hospital room?: A Little Help needed climbing 3-5 steps with a railing? : A Little 6 Click Score: 18    End of Session Equipment Utilized During Treatment: Gait belt Activity Tolerance: Patient tolerated treatment well Patient left: in bed;with call bell/phone within reach;with family/visitor present Nurse Communication: Mobility status PT Visit Diagnosis: Other abnormalities of gait and mobility (R26.89);Muscle weakness (generalized) (M62.81);Pain Pain - Right/Left: Left Pain - part of body: Knee     Time: 9150-9083 PT Time Calculation (min) (ACUTE ONLY): 27 min  Charges:    $Gait Training: 8-22 mins $Therapeutic Exercise: 8-22 mins PT General Charges $$ ACUTE PT VISIT: 1 Visit                     Alex Holt, PT, DPT Acute Rehabilitation Services Secure Chat Preferred Office:  [phone]    Alex Holt 05/05/2024, 12:26 PM

## 2024-05-05 NOTE — Anesthesia Postprocedure Evaluation (Signed)
 Anesthesia Post Note  Patient: Alex Holt  Procedure(s) Performed: ARTHROPLASTY, KNEE, TOTAL (Left: Knee)     Patient location during evaluation: PACU Anesthesia Type: Regional, MAC and Spinal Level of consciousness: awake and alert Pain management: pain level controlled Vital Signs Assessment: post-procedure vital signs reviewed and stable Respiratory status: spontaneous breathing, nonlabored ventilation and respiratory function stable Cardiovascular status: stable and blood pressure returned to baseline Postop Assessment: no apparent nausea or vomiting Anesthetic complications: no   No notable events documented.                Dann Galicia

## 2024-05-05 NOTE — Progress Notes (Signed)
 Subjective: 1 Day Post-Op Procedures (LRB): ARTHROPLASTY, KNEE, TOTAL (Left) Patient reports pain as mild.    Objective: Vital signs in last 24 hours: Temp:  [96.4 F (35.8 C)-100.2 F (37.9 C)] 100.2 F (37.9 C) (12/16 0759) Pulse Rate:  [58-110] 110 (12/16 0759) Resp:  [13-20] 20 (12/16 0759) BP: (109-189)/(64-109) 161/100 (12/16 0759) SpO2:  [93 %-100 %] 99 % (12/16 0759)  Intake/Output from previous day: 12/15 0701 - 12/16 0700 In: 1560 [P.O.:960; IV Piggyback:600] Out: 150 [Urine:100; Blood:50] Intake/Output this shift: No intake/output data recorded.  Recent Labs    05/04/24 0738  HGB 11.4*   Recent Labs    05/04/24 0738  WBC 7.1  RBC 5.26  HCT 36.3*  PLT 459*   Recent Labs    05/04/24 0738  NA 139  K 3.8  CL 103  CO2 23  BUN 18  CREATININE 1.22  GLUCOSE 92  CALCIUM  9.6   No results for input(s): LABPT, INR in the last 72 hours.  Neurologically intact Neurovascular intact Sensation intact distally Intact pulses distally Dorsiflexion/Plantar flexion intact Incision: dressing C/D/I No cellulitis present Compartment soft   Assessment/Plan: 1 Day Post-Op Procedures (LRB): ARTHROPLASTY, KNEE, TOTAL (Left) Advance diet Up with therapy D/C IV fluids Discharge home with home health once cleared by PT WBAT LLE      Ronal LITTIE Grave 05/05/2024, 8:00 AM

## 2024-05-07 ENCOUNTER — Ambulatory Visit

## 2024-05-11 ENCOUNTER — Ambulatory Visit: Admitting: Physical Therapy

## 2024-05-11 ENCOUNTER — Telehealth: Payer: Self-pay | Admitting: Orthopaedic Surgery

## 2024-05-11 ENCOUNTER — Other Ambulatory Visit: Payer: Self-pay | Admitting: Physician Assistant

## 2024-05-11 MED ORDER — OXYCODONE-ACETAMINOPHEN 5-325 MG PO TABS: 1.0000 | ORAL_TABLET | Freq: Three times a day (TID) | ORAL | 0 refills | Status: DC | PRN

## 2024-05-11 NOTE — Telephone Encounter (Signed)
 Tried to call patient. No answer. Voicemail is full. Will try again later.

## 2024-05-11 NOTE — Telephone Encounter (Signed)
 Pt called saying that he only has 2 of his antibiotics and only has 4 Oxycodone  left. He also needs some bandages. [facility] [address]. Call back number is [phone] / [phone].

## 2024-05-11 NOTE — Telephone Encounter (Signed)
 No need for abx refill.  Sent in oxy refill.  Ok to give bandage.  Is he draining thu aquacel?

## 2024-05-12 ENCOUNTER — Telehealth: Payer: Self-pay | Admitting: Orthopaedic Surgery

## 2024-05-12 NOTE — Telephone Encounter (Signed)
 Spoke with patient. He is aware that antibiotics are needed anymore. Also placed a new bandage up front for pick up.

## 2024-05-12 NOTE — Telephone Encounter (Signed)
 Pt called stating he called yesterday also asking for refill of antibiotics of doxycycline . Please send this in today to [facility]. Pt number is [phone].

## 2024-05-19 ENCOUNTER — Ambulatory Visit (INDEPENDENT_AMBULATORY_CARE_PROVIDER_SITE_OTHER): Admitting: Physician Assistant

## 2024-05-19 ENCOUNTER — Telehealth: Payer: Self-pay | Admitting: Orthopaedic Surgery

## 2024-05-19 ENCOUNTER — Ambulatory Visit

## 2024-05-19 DIAGNOSIS — Z96652 Presence of left artificial knee joint: Secondary | ICD-10-CM

## 2024-05-19 MED ORDER — OXYCODONE-ACETAMINOPHEN 5-325 MG PO TABS: 1.0000 | ORAL_TABLET | Freq: Three times a day (TID) | ORAL | 0 refills | Status: DC | PRN

## 2024-05-19 NOTE — Progress Notes (Signed)
 "  Post-Op Visit Note   Patient: Alex Holt           Date of Birth: Apr 06, 1966           MRN: [id number] Visit Date: 05/19/2024 PCP: Myra Kawasaki, NP (Inactive)   Assessment & Plan:  Chief Complaint:  Chief Complaint  Patient presents with   Left Knee - Routine Post Op    05/04/2024- L TKA   Visit Diagnoses:  1. Status post total left knee replacement     Plan: Patient is a pleasant 58 year old gentleman who comes in today 2 weeks status post left total knee replacement.  He has been doing well.  He has been taking oxycodone  for pain.  He has been compliant taking Eliquis  twice daily for DVT prophylaxis.  He has been getting home health PT and is ambulating with a walker.  He does tell me he starts outpatient PT on the fifth.  Examination of the left knee reveals a well-healed surgical incision with nylon sutures in place.  No evidence of infection or cellulitis.  Calves are soft nontender.  He is neurovascularly intact distally.  Today, sutures were removed and Steri-Strips applied.  He will continue with his Eliquis  twice daily for another 2 weeks and then transition to a baby aspirin twice daily for 2 weeks.  Follow-up in 4 weeks for repeat evaluation and 2 view x-rays of the left knee.  Call with concerns or questions.  Follow-Up Instructions: Return in about 4 weeks (around 06/16/2024).   Orders:  No orders of the defined types were placed in this encounter.  Meds ordered this encounter  Medications   oxyCODONE -acetaminophen  (PERCOCET) 5-325 MG tablet    Sig: Take 1 tablet by mouth 3 (three) times daily as needed. To be taken after surgery    Dispense:  20 tablet    Refill:  0    Imaging: No new imaging  PMFS History: Patient Active Problem List   Diagnosis Date Noted   Status post total left knee replacement 05/04/2024   Primary osteoarthritis of left knee 01/25/2022   Allergic rhinitis 12/23/2013   Flea bite of multiple sites 12/23/2013   Left ankle sprain  08/17/2013   CTS (carpal tunnel syndrome) 04/29/2012   Plantar fasciitis 04/10/2012   Insomnia 11/14/2010   Type 2 diabetes mellitus in patient with obesity (HCC) 11/14/2010   Chronic mental illness 09/25/2010   ANEMIA 03/08/2010   GERD 03/08/2010   HYPERLIPIDEMIA 01/30/2010   OBESITY 01/30/2010   Essential hypertension 01/30/2010   Past Medical History:  Diagnosis Date   Acid reflux    Arthritis    Diabetes mellitus without complication (HCC)    Hyperlipidemia    Hypertension    Schizophrenia (HCC)     Family History  Problem Relation Age of Onset   Mental illness Mother    Mental illness Other        FAMILY HISTORY    Diabetes Other     Past Surgical History:  Procedure Laterality Date   NO PAST SURGERIES     TOTAL KNEE ARTHROPLASTY Left 05/04/2024   Procedure: ARTHROPLASTY, KNEE, TOTAL;  Surgeon: Jerri Kay HERO, MD;  Location: [facility] OR;  Service: Orthopedics;  Laterality: Left;   Social History   Occupational History   Occupation: sELF EMPLOYED     Comment: [employer]   Tobacco Use   Smoking status: Never   Smokeless tobacco: Never  Vaping Use   Vaping status: Never Used  Substance and Sexual Activity   Alcohol use: Not Currently    Comment: Stopped drinked in Sept '24   Drug use: No   Sexual activity: Yes     "

## 2024-05-19 NOTE — Telephone Encounter (Signed)
 Called and gave verbal

## 2024-05-19 NOTE — Telephone Encounter (Signed)
 Jerel from [facility] called saying that the pt isn't doing well with therapy and he would like to have 2 more sessions with him.The pt's first out patient theraphy visit insn;t until next week. Call back number is [phone].

## 2024-05-20 ENCOUNTER — Ambulatory Visit

## 2024-05-22 ENCOUNTER — Other Ambulatory Visit: Payer: Self-pay | Admitting: Physician Assistant

## 2024-05-22 ENCOUNTER — Telehealth: Payer: Self-pay | Admitting: Physician Assistant

## 2024-05-22 MED ORDER — OXYCODONE-ACETAMINOPHEN 5-325 MG PO TABS: 1.0000 | ORAL_TABLET | Freq: Three times a day (TID) | ORAL | 0 refills | Status: DC | PRN

## 2024-05-22 MED ORDER — METHOCARBAMOL 500 MG PO TABS: 500.0000 mg | ORAL_TABLET | Freq: Two times a day (BID) | ORAL | 2 refills | Status: AC | PRN

## 2024-05-22 NOTE — Telephone Encounter (Signed)
 Called patient. LMOM.

## 2024-05-22 NOTE — Telephone Encounter (Signed)
 Pt called stating he need refill of oxycodone  and methocarbamol . Please send to [facility]. Pt number is [phone].

## 2024-05-22 NOTE — Telephone Encounter (Signed)
 Too early to refill, but I sent in anyway since it is Friday.  Unsure if pharmacy will refill.  Please make sure he is aware of the instructions on the bottle.  Robaxin  also sent in

## 2024-05-25 ENCOUNTER — Encounter: Payer: Self-pay | Admitting: Physical Therapy

## 2024-05-25 ENCOUNTER — Ambulatory Visit: Attending: Orthopaedic Surgery | Admitting: Physical Therapy

## 2024-05-25 ENCOUNTER — Other Ambulatory Visit: Payer: Self-pay

## 2024-05-25 ENCOUNTER — Telehealth: Payer: Self-pay | Admitting: Orthopaedic Surgery

## 2024-05-25 ENCOUNTER — Other Ambulatory Visit: Payer: Self-pay | Admitting: Physician Assistant

## 2024-05-25 DIAGNOSIS — M1712 Unilateral primary osteoarthritis, left knee: Secondary | ICD-10-CM | POA: Insufficient documentation

## 2024-05-25 DIAGNOSIS — R2681 Unsteadiness on feet: Secondary | ICD-10-CM | POA: Insufficient documentation

## 2024-05-25 DIAGNOSIS — M25562 Pain in left knee: Secondary | ICD-10-CM | POA: Diagnosis present

## 2024-05-25 DIAGNOSIS — M6281 Muscle weakness (generalized): Secondary | ICD-10-CM | POA: Insufficient documentation

## 2024-05-25 MED ORDER — OXYCODONE-ACETAMINOPHEN 5-325 MG PO TABS: 1.0000 | ORAL_TABLET | Freq: Three times a day (TID) | ORAL | 0 refills | Status: DC | PRN

## 2024-05-25 NOTE — Telephone Encounter (Signed)
Sent in a new rx

## 2024-05-25 NOTE — Therapy (Signed)
 " OUTPATIENT PHYSICAL THERAPY LOWER EXTREMITY EVALUATION  Patient Name: Alex Holt MRN: [id number] DOB:19-May-1966, 59 y.o., male Today's Date: 05/25/2024   PT End of Session - 05/25/24 1409     Visit Number 1    Date for Recertification  07/20/24    Authorization Type [employer]    PT Start Time 1330    PT Stop Time 1405    PT Time Calculation (min) 35 min          Past Medical History:  Diagnosis Date   Acid reflux    Arthritis    Diabetes mellitus without complication (HCC)    Hyperlipidemia    Hypertension    Schizophrenia (HCC)    Past Surgical History:  Procedure Laterality Date   NO PAST SURGERIES     TOTAL KNEE ARTHROPLASTY Left 05/04/2024   Procedure: ARTHROPLASTY, KNEE, TOTAL;  Surgeon: Jerri Kay HERO, MD;  Location: [facility] OR;  Service: Orthopedics;  Laterality: Left;   Patient Active Problem List   Diagnosis Date Noted   Status post total left knee replacement 05/04/2024   Primary osteoarthritis of left knee 01/25/2022   Allergic rhinitis 12/23/2013   Flea bite of multiple sites 12/23/2013   Left ankle sprain 08/17/2013   CTS (carpal tunnel syndrome) 04/29/2012   Plantar fasciitis 04/10/2012   Insomnia 11/14/2010   Type 2 diabetes mellitus in patient with obesity (HCC) 11/14/2010   Chronic mental illness 09/25/2010   ANEMIA 03/08/2010   GERD 03/08/2010   HYPERLIPIDEMIA 01/30/2010   OBESITY 01/30/2010   Essential hypertension 01/30/2010    PCP: Myra Kawasaki, NP (Inactive)  REFERRING PROVIDER: Jerri Kay HERO, MD  THERAPY DIAG:  Left knee pain, unspecified chronicity - Plan: PT plan of care cert/re-cert  Muscle weakness - Plan: PT plan of care cert/re-cert  Unsteadiness on feet - Plan: PT plan of care cert/re-cert  REFERRING DIAG: Primary osteoarthritis of left knee [M17.12]   Rationale for Evaluation and Treatment:  Rehabilitation  SUBJECTIVE:  PERTINENT PAST HISTORY:  DM TII, insomnia, schizophrenia        PRECAUTIONS: None  WEIGHT  BEARING RESTRICTIONS No  FALLS:  Has patient fallen in last 6 months? No, Number of falls: 0  MOI/History of condition:  Onset date: 05/04/2024  SUBJECTIVE STATEMENT  Pt is a 59 y.o. male who presents to clinic with chief complaint of L knee pain and stiffness following L TKA 05/04/2024.  Pain in bil knees for about 1 year.  Been doing ok but quite a bit of pain.    Pain:  Are you having pain? Yes Pain location: L knee pain NPRS scale:  Best: 4/10, Worst: 8/10 Aggravating factors: walking, standing Relieving factors: rest Pain description: sharp and aching  Occupation: washes cars  Assistive Device: FWW  Hand Dominance: na  Patient Goals/Specific Activities: reduce pain and improve function    OBJECTIVE:   GENERAL OBSERVATION/GAIT: Slow antalgic gait with reduced time in stance on L  PALPATION: Mod swelling about the L knee  LE MMT:  MMT Right (Eval) Left (Eval)  Hip flexion (L2, L3) 4 3  Knee extension (L3) 4 3  Knee flexion 4 3  Hip abduction    Hip extension    Hip external rotation    Hip internal rotation    Hip adduction    Ankle dorsiflexion (L4)    Ankle plantarflexion (S1)    Ankle inversion    Ankle eversion    Great Toe ext (L5)    Grossly     (  Blank rows = not tested, score listed is out of 5 possible points.  N = WNL, D = diminished, C = clear for gross weakness with myotome testing, * = concordant pain with testing)  LE ROM:  ROM Right (Eval) Left (Eval)  Hip flexion    Hip extension    Hip abduction    Hip adduction    Hip internal rotation    Hip external rotation    Knee extension n Lacking 11  Knee flexion n 70  Ankle dorsiflexion    Ankle plantarflexion    Ankle inversion    Ankle eversion     (Blank rows = not tested, N = WNL, * = concordant pain with testing)  Functional Tests  Eval                                                              PATIENT SURVEYS:  LEFS: 21/80  HOME EXERCISE  PROGRAM: Access Code: [email or web address] URL: [email or web address] Date: 05/25/2024 Prepared by: Helene Gasmen  Exercises - Supine Ankle Pumps  - 8 x daily - 7 x weekly - 2 sets - 10 reps - Supine Heel Slide with Strap  - 1 x daily - 7 x weekly - 3 sets - 10 reps - Seated Hamstring Stretch  - 2 x daily - 7 x weekly - 1 sets - 3 reps - 45 hold - Supine Quad Set  - 5 x daily - 7 x weekly - 2 sets - 10 reps - 10 hold - Ice  - 5 x daily - 7 x weekly - 1 sets - 1 reps - 20 min hold  Treatment priorities   Eval        Ext and flexion ROM        Gait, balance                                 TODAY'S TREATMENT:  Therapeutic Exercise: Creating, reviewing, and completing HEP   PATIENT EDUCATION ([place]/[facility]):  POC, diagnosis, prognosis, HEP, and outcome measures.  Pt educated via explanation, demonstration, and handout (HEP).  Pt confirms understanding verbally.   ASSESSMENT:  CLINICAL IMPRESSION: Alex Holt is a 59 y.o. male who presents to clinic with signs and sxs consistent with L knee pain and stiffness following L TKA on 12/15.  Doing fair overall with expected pain and ROM limitations.   Alex Holt will benefit from skilled PT to address relevant deficits to achieve independence with daily activities.   OBJECTIVE IMPAIRMENTS: Pain, L knee ROM, L knee and hip strength, gait, balance  ACTIVITY LIMITATIONS: standing, walking, squatting, work  PERSONAL FACTORS: See medical history and pertinent history   REHAB POTENTIAL: Good  CLINICAL DECISION MAKING: Evolving/moderate complexity  EVALUATION COMPLEXITY: Moderate   GOALS:   SHORT TERM GOALS: Target date: 06/22/2024   Alex Holt will be >75% HEP compliant to improve carryover between sessions and facilitate independent management of condition  Evaluation: ongoing Goal status: INITIAL   LONG TERM GOALS: Target date: 07/20/2024   Alex Holt will self report >/= 50% decrease in pain from evaluation to improve function in daily  tasks  Evaluation/Baseline: 8/10 max pain Goal status: INITIAL   2.  Alex Holt will show a >/= 27  pt improvement in LEFS score (MCID is ~11% or 9 pts) as a proxy for functional improvement   Evaluation/Baseline: 21 pts Goal status: INITIAL   3.  Alex Holt will be able to return to work, not limited by pain  Evaluation/Baseline: limited Goal status: INITIAL   4.  Alex Holt will achieve >/=115 degrees knee flexion to improve ability to complete transfers, squat, and navigate steps  Evaluation/Baseline: 70 degrees Goal status: INITIAL   5.  Lot will achieve </=3 degrees knee extension to improve mechanics of gait  Evaluation/Baseline: 11 degrees Goal status: INITIAL    PLAN: PT FREQUENCY: 1-2x/week  PT DURATION: 8 weeks  PLANNED INTERVENTIONS:  [id number]- PT Re-evaluation, 97110-Therapeutic exercises, 97530- Therapeutic activity, 97112- Neuromuscular re-education, 97535- Self Care, 02859- Manual therapy, [id number]- Gait training, [id number]- Aquatic Therapy, [phone]- Electrical stimulation (manual), [id number]- Vasopneumatic device, [id number]- Traction (mechanical), [id number]- Ionotophoresis 4mg /ml Dexamethasone , Taping, Dry Needling, Joint manipulation, and Spinal manipulation.   Dvora Buitron E Marquasia Schmieder PT 05/25/2024, 2:11 PM  "

## 2024-05-25 NOTE — Telephone Encounter (Signed)
 Rx refill Oxycodone   Pt states pharmacy needs Jerri signature in order to fill

## 2024-05-26 NOTE — Therapy (Signed)
 " OUTPATIENT PHYSICAL THERAPY LOWER EXTREMITY TREATMENT  Patient Name: Alex Holt MRN: [id number] DOB:1965-11-22, 59 y.o., male Today's Date: 05/27/2024   PT End of Session - 05/27/24 1427     Visit Number 1    Number of Visits --   1-2x per wk   Date for Recertification  07/20/24    Authorization Type [employer]    PT Start Time 1330    PT Stop Time 1415    PT Time Calculation (min) 45 min    Activity Tolerance Patient tolerated treatment well    Behavior During Therapy WFL for tasks assessed/performed           Past Medical History:  Diagnosis Date   Acid reflux    Arthritis    Diabetes mellitus without complication (HCC)    Hyperlipidemia    Hypertension    Schizophrenia (HCC)    Past Surgical History:  Procedure Laterality Date   NO PAST SURGERIES     TOTAL KNEE ARTHROPLASTY Left 05/04/2024   Procedure: ARTHROPLASTY, KNEE, TOTAL;  Surgeon: Alex Kay HERO, MD;  Location: [facility] OR;  Service: Orthopedics;  Laterality: Left;   Patient Active Problem List   Diagnosis Date Noted   Status post total left knee replacement 05/04/2024   Primary osteoarthritis of left knee 01/25/2022   Allergic rhinitis 12/23/2013   Flea bite of multiple sites 12/23/2013   Left ankle sprain 08/17/2013   CTS (carpal tunnel syndrome) 04/29/2012   Plantar fasciitis 04/10/2012   Insomnia 11/14/2010   Type 2 diabetes mellitus in patient with obesity (HCC) 11/14/2010   Chronic mental illness 09/25/2010   ANEMIA 03/08/2010   GERD 03/08/2010   HYPERLIPIDEMIA 01/30/2010   OBESITY 01/30/2010   Essential hypertension 01/30/2010    PCP: Alex Kawasaki, NP (Inactive)  REFERRING PROVIDER: Jerri Kay HERO, MD  THERAPY DIAG:  Left knee pain, unspecified chronicity  Muscle weakness  Unsteadiness on feet  REFERRING DIAG: Primary osteoarthritis of left knee [M17.12]   Rationale for Evaluation and Treatment:  Rehabilitation  SUBJECTIVE:  PERTINENT PAST HISTORY:  DM TII, insomnia,  schizophrenia        PRECAUTIONS: None  WEIGHT BEARING RESTRICTIONS No  FALLS:  Has patient fallen in last 6 months? No, Number of falls: 0  MOI/History of condition:  Onset date: 05/04/2024  SUBJECTIVE STATEMENT Pt reports his L knee has been stiff today. He notes being consistent with his HEP.  EVAL: Pt is a 59 y.o. male who presents to clinic with chief complaint of L knee pain and stiffness following L TKA 05/04/2024.  Pain in bil knees for about 1 year.  Been doing ok but quite a bit of pain.    Pain:  Are you having pain? Yes Pain location: L knee pain NPRS scale: Current: 5/10 Best: 4/10, Worst: 8/10 Aggravating factors: walking, standing Relieving factors: rest Pain description: sharp and aching  Occupation: washes cars  Assistive Device: FWW  Hand Dominance: na  Patient Goals/Specific Activities: reduce pain and improve function    OBJECTIVE:   GENERAL OBSERVATION/GAIT: Slow antalgic gait with reduced time in stance on L  PALPATION: Mod swelling about the L knee  LE MMT:  MMT Right (Eval) Left (Eval)  Hip flexion (L2, L3) 4 3  Knee extension (L3) 4 3  Knee flexion 4 3  Hip abduction    Hip extension    Hip external rotation    Hip internal rotation    Hip adduction    Ankle dorsiflexion (L4)  Ankle plantarflexion (S1)    Ankle inversion    Ankle eversion    Great Toe ext (L5)    Grossly     (Blank rows = not tested, score listed is out of 5 possible points.  N = WNL, D = diminished, C = clear for gross weakness with myotome testing, * = concordant pain with testing)  LE ROM:  ROM Right (Eval) Left (Eval) Lt  Hip flexion     Hip extension     Hip abduction     Hip adduction     Hip internal rotation     Hip external rotation     Knee extension n Lacking 11 Lacking 5  Knee flexion n 70 85  Ankle dorsiflexion     Ankle plantarflexion     Ankle inversion     Ankle eversion      (Blank rows = not tested, N = WNL, * = concordant  pain with testing)  Functional Tests  Eval                                                              PATIENT SURVEYS:  LEFS: 21/80  HOME EXERCISE PROGRAM: Access Code: [email or web address] URL: [email or web address] Date: 05/25/2024 Prepared by: Alex Holt  Exercises - Supine Ankle Pumps  - 8 x daily - 7 x weekly - 2 sets - 10 reps - Supine Heel Slide with Strap  - 1 x daily - 7 x weekly - 3 sets - 10 reps - Seated Hamstring Stretch  - 2 x daily - 7 x weekly - 1 sets - 3 reps - 45 hold - Supine Quad Set  - 5 x daily - 7 x weekly - 2 sets - 10 reps - 10 hold - Ice  - 5 x daily - 7 x weekly - 1 sets - 1 reps - 20 min hold  Treatment priorities   Eval        Ext and flexion ROM        Gait, balance                                  TODAY'S TREATMENT: [facility] Adult PT Treatment:                                                DATE: 05/27/24 Therapeutic Exercise: Nustep 8 mins L3 c progressive flexion  Supine Ankle Pumps x10 Supine Heel Slide s and c Strap x10, x5 30 Supine Quad Set  x10 10 AA SLR c QS x10 Standing L hip abd 2x10 Standing L knee flexion 2x10 Standing marching 2x10   Self Care: Ice  5x daily 20 min hold to manage pain and swelling  Therapeutic Exercise: Creating, reviewing, and completing HEP  PATIENT EDUCATION (Alex Holt/Alex Holt):  POC, diagnosis, prognosis, HEP, and outcome measures.  Pt educated via explanation, demonstration, and handout (HEP).  Pt confirms understanding verbally.   ASSESSMENT:  CLINICAL IMPRESSION: PT was completed for L knee ROM and strength. Both flexion and extension have made good gains since the eval 2 days ago.  Pt reports being consistent with his HEP. Pt tolerated prescribed exs without adverse effects. Pt will continue to benefit from skilled PT to address L knee impairments for improved functional mobility    EVAL: Alex Holt is a 59 y.o. male who presents to clinic with signs and sxs consistent with L knee pain  and stiffness following L TKA on 12/15.  Doing fair overall with expected pain and ROM limitations.   Alex Holt will benefit from skilled PT to address relevant deficits to achieve independence with daily activities.   OBJECTIVE IMPAIRMENTS: Pain, L knee ROM, L knee and hip strength, gait, balance  ACTIVITY LIMITATIONS: standing, walking, squatting, work  PERSONAL FACTORS: See medical history and pertinent history   REHAB POTENTIAL: Good  CLINICAL DECISION MAKING: Evolving/moderate complexity  EVALUATION COMPLEXITY: Moderate   GOALS:   SHORT TERM GOALS: Target date: 06/22/2024   Jalani will be >75% HEP compliant to improve carryover between sessions and facilitate independent management of condition  Evaluation: ongoing Goal status: INITIAL   LONG TERM GOALS: Target date: 07/20/2024   Diontae will self report >/= 50% decrease in pain from evaluation to improve function in daily tasks  Evaluation/Baseline: 8/10 max pain Goal status: INITIAL   2.  Dekker will show a >/= 27 pt improvement in LEFS score (MCID is ~11% or 9 pts) as a proxy for functional improvement   Evaluation/Baseline: 21 pts Goal status: INITIAL   3.  Micholas will be able to return to work, not limited by pain  Evaluation/Baseline: limited Goal status: INITIAL   4.  Eual will achieve >/=115 degrees knee flexion to improve ability to complete transfers, squat, and navigate steps  Evaluation/Baseline: 70 degrees Goal status: INITIAL   5.  Maleko will achieve </=3 degrees knee extension to improve mechanics of gait  Evaluation/Baseline: 11 degrees Goal status: INITIAL    PLAN: PT FREQUENCY: 1-2x/week  PT DURATION: 8 weeks  PLANNED INTERVENTIONS:  [id number]- PT Re-evaluation, 97110-Therapeutic exercises, 97530- Therapeutic activity, 97112- Neuromuscular re-education, 97535- Self Care, [id number]- Manual therapy, [id number]- Gait training, [id number]- Aquatic Therapy, [id number]- Electrical stimulation (manual), [id number]-  Vasopneumatic device, [id number]- Traction (mechanical), [id number]- Ionotophoresis 4mg /ml Dexamethasone , Taping, Dry Needling, Joint manipulation, and Spinal manipulation.  Melroy Bougher MS, PT 05/27/2024 2:39 PM   "

## 2024-05-27 ENCOUNTER — Ambulatory Visit

## 2024-05-27 DIAGNOSIS — M25562 Pain in left knee: Secondary | ICD-10-CM | POA: Diagnosis not present

## 2024-05-27 DIAGNOSIS — M6281 Muscle weakness (generalized): Secondary | ICD-10-CM

## 2024-05-27 DIAGNOSIS — R2681 Unsteadiness on feet: Secondary | ICD-10-CM

## 2024-05-30 ENCOUNTER — Ambulatory Visit
Admission: EM | Admit: 2024-05-30 | Discharge: 2024-05-30 | Disposition: A | Discharge status: Home / Self Care | Source: Ambulatory Visit | Attending: Family Medicine | Admitting: Family Medicine

## 2024-05-30 DIAGNOSIS — W57XXXA Bitten or stung by nonvenomous insect and other nonvenomous arthropods, initial encounter: Secondary | ICD-10-CM

## 2024-05-30 DIAGNOSIS — S70362A Insect bite (nonvenomous), left thigh, initial encounter: Secondary | ICD-10-CM | POA: Diagnosis not present

## 2024-05-30 MED ORDER — TRIAMCINOLONE ACETONIDE 0.1 % EX CREA: 1.0000 | TOPICAL_CREAM | Freq: Two times a day (BID) | CUTANEOUS | 0 refills | Status: AC

## 2024-05-30 MED ORDER — CETIRIZINE HCL 10 MG PO TABS: 10.0000 mg | ORAL_TABLET | Freq: Every day | ORAL | 0 refills | Status: AC

## 2024-05-30 NOTE — ED Triage Notes (Signed)
 Pt present with c/o a rash on the rt thigh. Pt states he wears a brace and has recently been taking medications after a recent a recent surgery. States he has been applying calamine and hydrocortisone for relief.

## 2024-05-30 NOTE — ED Provider Notes (Signed)
 " [facility]    CSN: [id number] Arrival date & time: 05/30/24  1300      History   Chief Complaint Chief Complaint  Patient presents with   Rash    HPI FLEMON KELTY is a 59 y.o. male presents for bug bites.  Patient reports itchy bug bites on the back of his left thigh.  He recently had knee replacement surgery and has been doing a lot of exercises while wearing shorts.  Denies any fevers, swelling, drainage, warmth.  Has been using topical hydrocortisone without improvement.  No other concerns.   Rash   Past Medical History:  Diagnosis Date   Acid reflux    Arthritis    Diabetes mellitus without complication (HCC)    Hyperlipidemia    Hypertension    Schizophrenia [facility])     Patient Active Problem List   Diagnosis Date Noted   Status post total left knee replacement 05/04/2024   Primary osteoarthritis of left knee 01/25/2022   Allergic rhinitis 12/23/2013   Flea bite of multiple sites 12/23/2013   Left ankle sprain 08/17/2013   CTS (carpal tunnel syndrome) 04/29/2012   Plantar fasciitis 04/10/2012   Insomnia 11/14/2010   Type 2 diabetes mellitus in patient with obesity (HCC) 11/14/2010   Chronic mental illness 09/25/2010   ANEMIA 03/08/2010   GERD 03/08/2010   HYPERLIPIDEMIA 01/30/2010   OBESITY 01/30/2010   Essential hypertension 01/30/2010    Past Surgical History:  Procedure Laterality Date   NO PAST SURGERIES     TOTAL KNEE ARTHROPLASTY Left 05/04/2024   Procedure: ARTHROPLASTY, KNEE, TOTAL;  Surgeon: Jerri Kay HERO, MD;  Location: [facility] OR;  Service: Orthopedics;  Laterality: Left;       Home Medications    Prior to Admission medications  Medication Sig Start Date End Date Taking? Authorizing Provider  amLODipine  (NORVASC ) 10 MG tablet Take 1 tablet by mouth daily. 01/28/23  Yes [provider]  apixaban  (ELIQUIS ) 2.5 MG TABS tablet Take one tablet by mouth twice daily for 30 days after surgery to prevent blood clots 05/04/24  Yes  Jule Ronal CROME, PA-C  cetirizine  (ZYRTEC ) 10 MG tablet Take 1 tablet (10 mg total) by mouth daily. 05/30/24  Yes Averie Meiner, Jodi R, NP  ciclopirox  (PENLAC ) 8 % solution Apply topically at bedtime. Apply over nail and surrounding skin. Apply daily over previous coat. After seven (7) days, may remove with alcohol and continue cycle. 04/23/24  Yes Naeem Quillin, Jodi R, NP  fluticasone  (FLONASE ) 50 MCG/ACT nasal spray Place 1 spray into both nostrils daily. Begin by using 2 sprays in each nare daily for 3 to 5 days, then decrease to 1 spray in each nare daily. Patient taking differently: Place 1-2 sprays into both nostrils daily as needed for allergies or rhinitis. 02/05/22  Yes Joesph Shaver Scales, PA-C  hydrochlorothiazide  (HYDRODIURIL ) 50 MG tablet Take 50 mg by mouth daily.   Yes [provider]  metFORMIN  (GLUCOPHAGE ) 500 MG tablet Take 500 mg by mouth 2 (two) times daily.   Yes [provider]  methocarbamol  (ROBAXIN ) 500 MG tablet Take 1 tablet (500 mg total) by mouth 2 (two) times daily as needed. 05/22/24  Yes Jule Ronal CROME, PA-C  triamcinolone  cream (KENALOG ) 0.1 % Apply 1 Application topically 2 (two) times daily. 05/30/24  Yes Onalee Steinbach, Jodi R, NP  valsartan (DIOVAN) 80 MG tablet Take 80 mg by mouth daily.   Yes [provider]  atorvastatin  (LIPITOR) 20 MG tablet Take 1 tablet (  20 mg total) by mouth at bedtime. For cholesterol Patient taking differently: Take 20 mg by mouth daily. 09/07/13   Wildwood, Theodoro FALCON, MD  docusate sodium  (COLACE) 100 MG capsule Take 1 capsule (100 mg total) by mouth daily as needed. Patient not taking: Reported on 05/04/2024 04/22/24 04/22/25  Jule Ronal CROME, PA-C  doxycycline  (VIBRAMYCIN ) 100 MG capsule Take 1 capsule (100 mg total) by mouth 2 (two) times daily. To be taken after surgery Patient not taking: Reported on 05/04/2024 04/22/24   Jule Ronal CROME, PA-C  ondansetron  (ZOFRAN ) 4 MG tablet Take 1 tablet (4 mg total) by mouth every 8 (eight)  hours as needed for nausea or vomiting. Patient not taking: Reported on 05/04/2024 04/22/24   Jule Ronal CROME, PA-C  Semaglutide,0.25 or 0.5MG /DOS, (OZEMPIC, 0.25 OR 0.5 MG/DOSE,) 2 MG/3ML SOPN Inject 0.5 mg into the skin once a week. Patient not taking: Reported on 05/04/2024    [provider]    Family History Family History  Problem Relation Age of Onset   Mental illness Mother    Mental illness Other        FAMILY HISTORY    Diabetes Other     Social History Social History[1]   Allergies   Patient has no known allergies.   Review of Systems Review of Systems  Skin:        Bug bite     Physical Exam Triage Vital Signs ED Triage Vitals  Encounter Vitals Group     BP 05/30/24 1449 (!) 137/90     Girls Systolic BP Percentile --      Girls Diastolic BP Percentile --      Boys Systolic BP Percentile --      Boys Diastolic BP Percentile --      Pulse Rate 05/30/24 1449 (!) 105     Resp --      Temp 05/30/24 1449 99 F (37.2 C)     Temp Source 05/30/24 1449 Oral     SpO2 05/30/24 1449 98 %     Weight --      Height --      Head Circumference --      Peak Flow --      Pain Score 05/30/24 1447 0     Pain Loc --      Pain Education --      Exclude from Growth Chart --    No data found.  Updated Vital Signs BP (!) 137/90   Pulse (!) 105   Temp 99 F (37.2 C) (Oral)   SpO2 98%   Visual Acuity Right Eye Distance:   Left Eye Distance:   Bilateral Distance:    Right Eye Near:   Left Eye Near:    Bilateral Near:     Physical Exam Vitals and nursing note reviewed.  Constitutional:      General: He is not in acute distress.    Appearance: Normal appearance. He is not ill-appearing.  HENT:     Head: Normocephalic and atraumatic.  Eyes:     Pupils: Pupils are equal, round, and reactive to light.  Cardiovascular:     Rate and Rhythm: Normal rate.     Comments: Heart rate recheck 93 Pulmonary:     Effort: Pulmonary effort is normal.   Skin:    General: Skin is warm and dry.         Comments: Several insect bites to the left posterior thigh.  There is no induration fluctuance,  drainage, swelling, warmth.  No vesicles lesions.  Neurological:     General: No focal deficit present.     Mental Status: He is alert and oriented to person, place, and time.  Psychiatric:        Mood and Affect: Mood normal.        Behavior: Behavior normal.      UC Treatments / Results  Labs (all labs ordered are listed, but only abnormal results are displayed) Labs Reviewed - No data to display  EKG   Radiology No results found.  Procedures Procedures (including critical care time)  Medications Ordered in UC Medications - No data to display  Initial Impression / Assessment and Plan / UC Course  I have reviewed the triage vital signs and the nursing notes.  Pertinent labs & imaging results that were available during my care of the patient were reviewed by me and considered in my medical decision making (see chart for details).     Reviewed exam and symptoms with patient.  Will do topical triamcinolone  twice daily and cetirizine  daily.  PCP follow-up 2 to 3 days for recheck.  ER precautions reviewed. Final Clinical Impressions(s) / UC Diagnoses   Final diagnoses:  Insect bite of left thigh, initial encounter     Discharge Instructions      Start triamcinolone  topical steroid cream twice daily to the insect bites for itching.  Also take cetirizine  daily.  Follow-up with your PCP in 2 to 3 days for recheck.  Please go to the emergency room for any worsening symptoms.  Hope you feel better soon exclamation    ED Prescriptions     Medication Sig Dispense Auth. Provider   triamcinolone  cream (KENALOG ) 0.1 % Apply 1 Application topically 2 (two) times daily. 30 g Evaan Tidwell, Jodi R, NP   cetirizine  (ZYRTEC ) 10 MG tablet Take 1 tablet (10 mg total) by mouth daily. 30 tablet Myca Perno, Jodi R, NP      PDMP not reviewed this  encounter.    [1]  Social History Tobacco Use   Smoking status: Never   Smokeless tobacco: Never  Vaping Use   Vaping status: Never Used  Substance Use Topics   Alcohol use: Not Currently    Comment: Stopped drinked in Sept '24   Drug use: No     Loreda Myla SAUNDERS, NP 05/30/24 1505  "

## 2024-05-30 NOTE — Discharge Instructions (Addendum)
 Start triamcinolone  topical steroid cream twice daily to the insect bites for itching.  Also take cetirizine  daily.  Follow-up with your PCP in 2 to 3 days for recheck.  Please go to the emergency room for any worsening symptoms.  Hope you feel better soon exclamation

## 2024-06-02 NOTE — Therapy (Signed)
 " OUTPATIENT PHYSICAL THERAPY LOWER EXTREMITY TREATMENT  Patient Name: Alex Holt MRN: [id number] DOB:1965/12/25, 59 y.o., male Today's Date: 06/03/2024   PT End of Session - 06/03/24 1336     Visit Number 3    Number of Visits --   1-2x per week   Date for Recertification  07/20/24    Authorization Type [employer]    PT Start Time 1330    PT Stop Time 1412    PT Time Calculation (min) 42 min    Activity Tolerance Patient tolerated treatment well    Behavior During Therapy WFL for tasks assessed/performed            Past Medical History:  Diagnosis Date   Acid reflux    Arthritis    Diabetes mellitus without complication (HCC)    Hyperlipidemia    Hypertension    Schizophrenia (HCC)    Past Surgical History:  Procedure Laterality Date   NO PAST SURGERIES     TOTAL KNEE ARTHROPLASTY Left 05/04/2024   Procedure: ARTHROPLASTY, KNEE, TOTAL;  Surgeon: Jerri Kay HERO, MD;  Location: [facility] OR;  Service: Orthopedics;  Laterality: Left;   Patient Active Problem List   Diagnosis Date Noted   Status post total left knee replacement 05/04/2024   Primary osteoarthritis of left knee 01/25/2022   Allergic rhinitis 12/23/2013   Flea bite of multiple sites 12/23/2013   Left ankle sprain 08/17/2013   CTS (carpal tunnel syndrome) 04/29/2012   Plantar fasciitis 04/10/2012   Insomnia 11/14/2010   Type 2 diabetes mellitus in patient with obesity (HCC) 11/14/2010   Chronic mental illness 09/25/2010   ANEMIA 03/08/2010   GERD 03/08/2010   HYPERLIPIDEMIA 01/30/2010   OBESITY 01/30/2010   Essential hypertension 01/30/2010    PCP: Myra Kawasaki, NP (Inactive)  REFERRING PROVIDER: Jerri Kay HERO, MD  THERAPY DIAG:  Left knee pain, unspecified chronicity  Muscle weakness  Unsteadiness on feet  REFERRING DIAG: Primary osteoarthritis of left knee [M17.12]   Rationale for Evaluation and Treatment:  Rehabilitation  SUBJECTIVE:  PERTINENT PAST HISTORY:  DM TII, insomnia,  schizophrenia        PRECAUTIONS: None  WEIGHT BEARING RESTRICTIONS No  FALLS:  Has patient fallen in last 6 months? No, Number of falls: 0  MOI/History of condition:  Onset date: 05/04/2024  SUBJECTIVE STATEMENT Pt reports his L knee is improving.  EVAL: Pt is a 59 y.o. male who presents to clinic with chief complaint of L knee pain and stiffness following L TKA 05/04/2024.  Pain in bil knees for about 1 year.  Been doing ok but quite a bit of pain.    Pain:  Are you having pain? Yes Pain location: L knee pain NPRS scale: Current: 7/10 Best: 4/10, Worst: 8/10 Aggravating factors: walking, standing Relieving factors: rest Pain description: sharp and aching  Occupation: washes cars  Assistive Device: FWW  Hand Dominance: na  Patient Goals/Specific Activities: reduce pain and improve function    OBJECTIVE:   GENERAL OBSERVATION/GAIT: Slow antalgic gait with reduced time in stance on L  PALPATION: Mod swelling about the L knee  LE MMT:  MMT Right (Eval) Left (Eval)  Hip flexion (L2, L3) 4 3  Knee extension (L3) 4 3  Knee flexion 4 3  Hip abduction    Hip extension    Hip external rotation    Hip internal rotation    Hip adduction    Ankle dorsiflexion (L4)    Ankle plantarflexion (S1)  Ankle inversion    Ankle eversion    Great Toe ext (L5)    Grossly     (Blank rows = not tested, score listed is out of 5 possible points.  N = WNL, D = diminished, C = clear for gross weakness with myotome testing, * = concordant pain with testing)  LE ROM:  ROM Right (Eval) Left (Eval) Lt Lt 06/03/24  Hip flexion      Hip extension      Hip abduction      Hip adduction      Hip internal rotation      Hip external rotation      Knee extension n Lacking 11 Lacking 5 Lacking 3  Knee flexion n 70 85 90  Ankle dorsiflexion      Ankle plantarflexion      Ankle inversion      Ankle eversion       (Blank rows = not tested, N = WNL, * = concordant pain with  testing)  Functional Tests  Eval                                                              PATIENT SURVEYS:  LEFS: 21/80  HOME EXERCISE PROGRAM: Access Code: [email or web address] URL: [email or web address] Date: 06/03/2024 Prepared by: Dasie Daft  Exercises - Supine Ankle Pumps  - 8 x daily - 7 x weekly - 2 sets - 10 reps - Supine Heel Slide with Strap  - 1 x daily - 7 x weekly - 3 sets - 10 reps - Seated Hamstring Stretch  - 2 x daily - 7 x weekly - 1 sets - 3 reps - 45 hold - Active Straight Leg Raise with Quad Set  - 1 x daily - 7 x weekly - 3 sets - 10 reps - Supine Quad Set  - 5 x daily - 7 x weekly - 2 sets - 10 reps - 10 hold - Ice  - 5 x daily - 7 x weekly - 1 sets - 1 reps - 20 min hold - Seated Long Arc Quad  - 1 x daily - 7 x weekly - 2 sets - 10 reps - 3 hold - Sit to Stand Without Arm Support  - 1 x daily - 7 x weekly - 2 sets - 10 reps - Heel Toe Raises with Counter Support  - 1 x daily - 7 x weekly - 2 sets - 10 reps - 2 hold  Treatment priorities   Eval        Ext and flexion ROM        Gait, balance                                  TODAY'S TREATMENT: [facility] Adult PT Treatment:                                                DATE: 06/03/24 Therapeutic Exercise: Supine Heel Slide s and c Strap x10, x5 30 Supine Quad Set  x10 10 AA SLR c QS x10 LAQ 3x10  STS 2x10, favored L knee Standing Heel raise/toe lifts 2x10 Therapeutic Activity: Nustep L4 c progressive flexion Gait training with SPC  SL standing L LE c hand A as needed  [facility] Adult PT Treatment:                                                DATE: 05/27/24 Therapeutic Exercise: Nustep 8 mins L3 c progressive flexion  Supine Ankle Pumps x10 Supine Heel Slide s and c Strap x10, x5 30 Supine Quad Set  x10 10 AA SLR c QS x10 Standing L hip abd 2x10 Standing L knee flexion 2x10 Standing marching 2x10   Self Care: Ice  5x daily 20 min hold to manage pain and  swelling  Therapeutic Exercise: Creating, reviewing, and completing HEP  PATIENT EDUCATION ([place]/[facility]):  POC, diagnosis, prognosis, HEP, and outcome measures.  Pt educated via explanation, demonstration, and handout (HEP).  Pt confirms understanding verbally.   ASSESSMENT:  CLINICAL IMPRESSION: PT was completed for L knee ROM, strength, and gait training. Pt demonstrated appropriate balance with assistance from a [facility] and is going to look into obtaining one. AROM has made small gains since the last PT session. Will address knee flexion ROM the next PT visit. Pt tolerated prescribed exs without adverse effects.  EVAL: Alex Holt is a 59 y.o. male who presents to clinic with signs and sxs consistent with L knee pain and stiffness following L TKA on 12/15.  Doing fair overall with expected pain and ROM limitations.   Alex Holt will benefit from skilled PT to address relevant deficits to achieve independence with daily activities.   OBJECTIVE IMPAIRMENTS: Pain, L knee ROM, L knee and hip strength, gait, balance  ACTIVITY LIMITATIONS: standing, walking, squatting, work  PERSONAL FACTORS: See medical history and pertinent history   REHAB POTENTIAL: Good  CLINICAL DECISION MAKING: Evolving/moderate complexity  EVALUATION COMPLEXITY: Moderate   GOALS:   SHORT TERM GOALS: Target date: 06/22/2024   Alex Holt will be >75% HEP compliant to improve carryover between sessions and facilitate independent management of condition  Evaluation: ongoing Goal status: INITIAL   LONG TERM GOALS: Target date: 07/20/2024   Moise will self report >/= 50% decrease in pain from evaluation to improve function in daily tasks  Evaluation/Baseline: 8/10 max pain Goal status: INITIAL   2.  Alex Holt will show a >/= 27 pt improvement in LEFS score (MCID is ~11% or 9 pts) as a proxy for functional improvement   Evaluation/Baseline: 21 pts Goal status: INITIAL   3.  Alex Holt will be able to return to work, not limited by  pain  Evaluation/Baseline: limited Goal status: INITIAL   4.  Alex Holt will achieve >/=115 degrees knee flexion to improve ability to complete transfers, squat, and navigate steps  Evaluation/Baseline: 70 degrees Goal status: INITIAL   5.  Alex Holt will achieve </=3 degrees knee extension to improve mechanics of gait  Evaluation/Baseline: 11 degrees Goal status: INITIAL    PLAN: PT FREQUENCY: 1-2x/week  PT DURATION: 8 weeks  PLANNED INTERVENTIONS:  [id number]- PT Re-evaluation, 97110-Therapeutic exercises, 97530- Therapeutic activity, 97112- Neuromuscular re-education, 97535- Self Care, 02859- Manual therapy, [id number]- Gait training, [id number]- Aquatic Therapy, [phone]- Electrical stimulation (manual), [id number]- Vasopneumatic device, [id number]- Traction (mechanical), [id number]- Ionotophoresis 4mg /ml Dexamethasone , Taping, Dry Needling, Joint manipulation, and Spinal manipulation.  Seddrick Flax MS, PT 06/03/2024 5:10 PM   "

## 2024-06-03 ENCOUNTER — Ambulatory Visit

## 2024-06-03 DIAGNOSIS — M25562 Pain in left knee: Secondary | ICD-10-CM | POA: Diagnosis not present

## 2024-06-03 DIAGNOSIS — R2681 Unsteadiness on feet: Secondary | ICD-10-CM

## 2024-06-03 DIAGNOSIS — M6281 Muscle weakness (generalized): Secondary | ICD-10-CM

## 2024-06-08 NOTE — Therapy (Incomplete)
 " OUTPATIENT PHYSICAL THERAPY LOWER EXTREMITY TREATMENT  Patient Name: Alex Holt MRN: [id number] DOB:1966-02-28, 59 y.o., male Today's Date: 06/08/2024       Past Medical History:  Diagnosis Date   Acid reflux    Arthritis    Diabetes mellitus without complication (HCC)    Hyperlipidemia    Hypertension    Schizophrenia (HCC)    Past Surgical History:  Procedure Laterality Date   NO PAST SURGERIES     TOTAL KNEE ARTHROPLASTY Left 05/04/2024   Procedure: ARTHROPLASTY, KNEE, TOTAL;  Surgeon: Jerri Kay HERO, MD;  Location: [facility] OR;  Service: Orthopedics;  Laterality: Left;   Patient Active Problem List   Diagnosis Date Noted   Status post total left knee replacement 05/04/2024   Primary osteoarthritis of left knee 01/25/2022   Allergic rhinitis 12/23/2013   Flea bite of multiple sites 12/23/2013   Left ankle sprain 08/17/2013   CTS (carpal tunnel syndrome) 04/29/2012   Plantar fasciitis 04/10/2012   Insomnia 11/14/2010   Type 2 diabetes mellitus in patient with obesity (HCC) 11/14/2010   Chronic mental illness 09/25/2010   ANEMIA 03/08/2010   GERD 03/08/2010   HYPERLIPIDEMIA 01/30/2010   OBESITY 01/30/2010   Essential hypertension 01/30/2010    PCP: Myra Kawasaki, NP (Inactive)  REFERRING PROVIDER: Jerri Kay HERO, MD  THERAPY DIAG:  No diagnosis found.  REFERRING DIAG: Primary osteoarthritis of left knee [M17.12]   Rationale for Evaluation and Treatment:  Rehabilitation  SUBJECTIVE:  PERTINENT PAST HISTORY:  DM TII, insomnia, schizophrenia        PRECAUTIONS: None  WEIGHT BEARING RESTRICTIONS No  FALLS:  Has patient fallen in last 6 months? No, Number of falls: 0  MOI/History of condition:  Onset date: 05/04/2024  SUBJECTIVE STATEMENT Pt reports his L knee is improving.  EVAL: Pt is a 59 y.o. male who presents to clinic with chief complaint of L knee pain and stiffness following L TKA 05/04/2024.  Pain in bil knees for about 1 year.  Been doing  ok but quite a bit of pain.    Pain:  Are you having pain? Yes Pain location: L knee pain NPRS scale: Current: 7/10 Best: 4/10, Worst: 8/10 Aggravating factors: walking, standing Relieving factors: rest Pain description: sharp and aching  Occupation: washes cars  Assistive Device: FWW  Hand Dominance: na  Patient Goals/Specific Activities: reduce pain and improve function    OBJECTIVE:   GENERAL OBSERVATION/GAIT: Slow antalgic gait with reduced time in stance on L  PALPATION: Mod swelling about the L knee  LE MMT:  MMT Right (Eval) Left (Eval)  Hip flexion (L2, L3) 4 3  Knee extension (L3) 4 3  Knee flexion 4 3  Hip abduction    Hip extension    Hip external rotation    Hip internal rotation    Hip adduction    Ankle dorsiflexion (L4)    Ankle plantarflexion (S1)    Ankle inversion    Ankle eversion    Great Toe ext (L5)    Grossly     (Blank rows = not tested, score listed is out of 5 possible points.  N = WNL, D = diminished, C = clear for gross weakness with myotome testing, * = concordant pain with testing)  LE ROM:  ROM Right (Eval) Left (Eval) Lt Lt 06/03/24  Hip flexion      Hip extension      Hip abduction      Hip adduction  Hip internal rotation      Hip external rotation      Knee extension n Lacking 11 Lacking 5 Lacking 3  Knee flexion n 70 85 90  Ankle dorsiflexion      Ankle plantarflexion      Ankle inversion      Ankle eversion       (Blank rows = not tested, N = WNL, * = concordant pain with testing)  Functional Tests  Eval                                                              PATIENT SURVEYS:  LEFS: 21/80  HOME EXERCISE PROGRAM: Access Code: [email or web address] URL: [email or web address] Date: 06/03/2024 Prepared by: Dasie Daft  Exercises - Supine Ankle Pumps  - 8 x daily - 7 x weekly - 2 sets - 10 reps - Supine Heel Slide with Strap  - 1 x daily - 7 x weekly - 3 sets - 10 reps -  Seated Hamstring Stretch  - 2 x daily - 7 x weekly - 1 sets - 3 reps - 45 hold - Active Straight Leg Raise with Quad Set  - 1 x daily - 7 x weekly - 3 sets - 10 reps - Supine Quad Set  - 5 x daily - 7 x weekly - 2 sets - 10 reps - 10 hold - Ice  - 5 x daily - 7 x weekly - 1 sets - 1 reps - 20 min hold - Seated Long Arc Quad  - 1 x daily - 7 x weekly - 2 sets - 10 reps - 3 hold - Sit to Stand Without Arm Support  - 1 x daily - 7 x weekly - 2 sets - 10 reps - Heel Toe Raises with Counter Support  - 1 x daily - 7 x weekly - 2 sets - 10 reps - 2 hold  Treatment priorities   Eval        Ext and flexion ROM        Gait, balance                                  TODAY'S TREATMENT: [facility] Adult PT Treatment:                                                DATE: 06/09/24 Therapeutic Exercise: Supine Heel Slide s and c Strap x10, x5 30 Supine Quad Set  x10 10 AA SLR c QS x10 LAQ 3x10 STS 2x10, favored L knee Standing Heel raise/toe lifts 2x10 Therapeutic Activity: Nustep L4 c progressive flexion Gait training with SPC  SL standing L LE c hand A as needed Therapeutic Exercise: *** Manual Therapy: *** Neuromuscular re-ed: *** Therapeutic Activity: *** Modalities: *** Self Care: ***  [facility] Adult PT Treatment:  DATE: 06/03/24 Therapeutic Exercise: Supine Heel Slide s and c Strap x10, x5 30 Supine Quad Set  x10 10 AA SLR c QS x10 LAQ 3x10 STS 2x10, favored L knee Standing Heel raise/toe lifts 2x10 Therapeutic Activity: Nustep L4 c progressive flexion Gait training with SPC  SL standing L LE c hand A as needed  [facility] Adult PT Treatment:                                                DATE: 05/27/24 Therapeutic Exercise: Nustep 8 mins L3 c progressive flexion  Supine Ankle Pumps x10 Supine Heel Slide s and c Strap x10, x5 30 Supine Quad Set  x10 10 AA SLR c QS x10 Standing L hip abd 2x10 Standing L knee flexion  2x10 Standing marching 2x10   Self Care: Ice  5x daily 20 min hold to manage pain and swelling  Therapeutic Exercise: Creating, reviewing, and completing HEP  PATIENT EDUCATION ([place]/[facility]):  POC, diagnosis, prognosis, HEP, and outcome measures.  Pt educated via explanation, demonstration, and handout (HEP).  Pt confirms understanding verbally.   ASSESSMENT:  CLINICAL IMPRESSION: PT was completed for L knee ROM, strength, and gait training. Pt demonstrated appropriate balance with assistance from a [facility] and is going to look into obtaining one. AROM has made small gains since the last PT session. Will address knee flexion ROM the next PT visit. Pt tolerated prescribed exs without adverse effects.  EVAL: Antavius is a 59 y.o. male who presents to clinic with signs and sxs consistent with L knee pain and stiffness following L TKA on 12/15.  Doing fair overall with expected pain and ROM limitations.   Manveer will benefit from skilled PT to address relevant deficits to achieve independence with daily activities.   OBJECTIVE IMPAIRMENTS: Pain, L knee ROM, L knee and hip strength, gait, balance  ACTIVITY LIMITATIONS: standing, walking, squatting, work  PERSONAL FACTORS: See medical history and pertinent history   REHAB POTENTIAL: Good  CLINICAL DECISION MAKING: Evolving/moderate complexity  EVALUATION COMPLEXITY: Moderate   GOALS:   SHORT TERM GOALS: Target date: 06/22/2024   Elise will be >75% HEP compliant to improve carryover between sessions and facilitate independent management of condition  Evaluation: ongoing Goal status: INITIAL   LONG TERM GOALS: Target date: 07/20/2024   Eryc will self report >/= 50% decrease in pain from evaluation to improve function in daily tasks  Evaluation/Baseline: 8/10 max pain Goal status: INITIAL   2.  Kipton will show a >/= 27 pt improvement in LEFS score (MCID is ~11% or 9 pts) as a proxy for functional improvement   Evaluation/Baseline: 21  pts Goal status: INITIAL   3.  Eileen will be able to return to work, not limited by pain  Evaluation/Baseline: limited Goal status: INITIAL   4.  Daxter will achieve >/=115 degrees knee flexion to improve ability to complete transfers, squat, and navigate steps  Evaluation/Baseline: 70 degrees Goal status: INITIAL   5.  Conal will achieve </=3 degrees knee extension to improve mechanics of gait  Evaluation/Baseline: 11 degrees Goal status: INITIAL    PLAN: PT FREQUENCY: 1-2x/week  PT DURATION: 8 weeks  PLANNED INTERVENTIONS:  97164- PT Re-evaluation, 97110-Therapeutic exercises, 97530- Therapeutic activity, [id number]- Neuromuscular re-education, 97535- Self Care, 02859- Manual therapy, [id number]- Gait training, [phone]- Aquatic Therapy, [phone]- Electrical stimulation (manual), [id number]- Vasopneumatic device,  02987- Traction (mechanical), 02966- Ionotophoresis 4mg /ml Dexamethasone , Taping, Dry Needling, Joint manipulation, and Spinal manipulation.  Savvas Roper MS, PT 06/08/24 9:39 PM   "

## 2024-06-09 ENCOUNTER — Ambulatory Visit

## 2024-06-10 NOTE — Therapy (Signed)
 " OUTPATIENT PHYSICAL THERAPY LOWER EXTREMITY TREATMENT  Patient Name: Alex Holt MRN: [id number] DOB:[birth year], 59 y.o., male Today's Date: 06/10/2024       Past Medical History:  Diagnosis Date   Acid reflux    Arthritis    Diabetes mellitus without complication (HCC)    Hyperlipidemia    Hypertension    Schizophrenia (HCC)    Past Surgical History:  Procedure Laterality Date   NO PAST SURGERIES     TOTAL KNEE ARTHROPLASTY Left 05/04/2024   Procedure: ARTHROPLASTY, KNEE, TOTAL;  Surgeon: Jerri Kay HERO, MD;  Location: [facility] OR;  Service: Orthopedics;  Laterality: Left;   Patient Active Problem List   Diagnosis Date Noted   Status post total left knee replacement 05/04/2024   Primary osteoarthritis of left knee 01/25/2022   Allergic rhinitis 12/23/2013   Flea bite of multiple sites 12/23/2013   Left ankle sprain 08/17/2013   CTS (carpal tunnel syndrome) 04/29/2012   Plantar fasciitis 04/10/2012   Insomnia 11/14/2010   Type 2 diabetes mellitus in patient with obesity (HCC) 11/14/2010   Chronic mental illness 09/25/2010   ANEMIA 03/08/2010   GERD 03/08/2010   HYPERLIPIDEMIA 01/30/2010   OBESITY 01/30/2010   Essential hypertension 01/30/2010    PCP: Myra Kawasaki, NP (Inactive)  REFERRING PROVIDER: Jerri Kay HERO, MD  THERAPY DIAG:  No diagnosis found.  REFERRING DIAG: Primary osteoarthritis of left knee [M17.12]   Rationale for Evaluation and Treatment:  Rehabilitation  SUBJECTIVE:  PERTINENT PAST HISTORY:  DM TII, insomnia, schizophrenia        PRECAUTIONS: None  WEIGHT BEARING RESTRICTIONS No  FALLS:  Has patient fallen in last 6 months? No, Number of falls: 0  MOI/History of condition:  Onset date: 05/04/2024  SUBJECTIVE STATEMENT Pt reports last week his R knee started hurting and swelling which has significantly affeted his ability to walk. Pt endorses completing his HEP daily  EVAL: Pt is a 59 y.o. male who presents to clinic with chief  complaint of L knee pain and stiffness following L TKA 05/04/2024.  Pain in bil knees for about 1 year.  Been doing ok but quite a bit of pain.    Pain:  Are you having pain? Yes Pain location: L knee pain NPRS scale: Current: 5/10. R 10/10 Best: 4/10, Worst: 8/10 Aggravating factors: walking, standing Relieving factors: rest Pain description: sharp and aching  Occupation: washes cars  Assistive Device: FWW  Hand Dominance: na  Patient Goals/Specific Activities: reduce pain and improve function    OBJECTIVE:   GENERAL OBSERVATION/GAIT: Slow antalgic gait with reduced time in stance on L  PALPATION: Mod swelling about the L knee  LE MMT:  MMT Right (Eval) Left (Eval)  Hip flexion (L2, L3) 4 3  Knee extension (L3) 4 3  Knee flexion 4 3  Hip abduction    Hip extension    Hip external rotation    Hip internal rotation    Hip adduction    Ankle dorsiflexion (L4)    Ankle plantarflexion (S1)    Ankle inversion    Ankle eversion    Great Toe ext (L5)    Grossly     (Blank rows = not tested, score listed is out of 5 possible points.  N = WNL, D = diminished, C = clear for gross weakness with myotome testing, * = concordant pain with testing)  LE ROM:  ROM Right (Eval) Left (Eval) Lt Lt 06/03/24 Lt   Hip flexion  Hip extension       Hip abduction       Hip adduction       Hip internal rotation       Hip external rotation       Knee extension n Lacking 11 Lacking 5 Lacking 3 Lacking 3  Knee flexion n 70 85 90 95 AAROM  Ankle dorsiflexion       Ankle plantarflexion       Ankle inversion       Ankle eversion        (Blank rows = not tested, N = WNL, * = concordant pain with testing)  Functional Tests  Eval                                                              PATIENT SURVEYS:  LEFS: 21/80  HOME EXERCISE PROGRAM: Access Code: [email or web address] URL: [email or web address] Date: 06/11/2024 Prepared by: Dasie Daft  Exercises - Supine Ankle Pumps  - 8 x daily - 7 x weekly - 2 sets - 10 reps - Supine Heel Slide with Strap  - 1 x daily - 7 x weekly - 3 sets - 10 reps - Seated Hamstring Stretch  - 2 x daily - 7 x weekly - 1 sets - 3 reps - 45 hold - Active Straight Leg Raise with Quad Set  - 1 x daily - 7 x weekly - 3 sets - 10 reps - Supine Quad Set  - 5 x daily - 7 x weekly - 2 sets - 10 reps - 10 hold - Ice  - 5 x daily - 7 x weekly - 1 sets - 1 reps - 20 min hold - Seated Long Arc Quad  - 1 x daily - 7 x weekly - 2 sets - 10 reps - 3 hold - Sit to Stand Without Arm Support  - 1 x daily - 7 x weekly - 2 sets - 10 reps - Heel Toe Raises with Counter Support  - 1 x daily - 7 x weekly - 2 sets - 10 reps - 2 hold - Sidelying Hip Abduction  - 1 x daily - 7 x weekly - 3 sets - 10 reps - 3 hold - Clamshell with Resistance  - 1 x daily - 7 x weekly - 3 sets - 10 reps - 3 hold - Seated Heel Slide  - 2 x daily - 7 x weekly - 1 sets - 3 reps - 60 hold  Treatment priorities   Eval        Ext and flexion ROM        Gait, balance                                  TODAY'S TREATMENT: [facility] Adult PT Treatment:                                                DATE: 06/09/24 Therapeutic Exercise: Supine Heel Slide s and c Strap x10, x5 30 Supine Quad Set  x10 10 AA SLR c QS x10  SAQ 3x10 5# S/L hip abd 3x10 5# S/L clams 3x10 BluTB Seated knee flexion c over pressure  [facility] Adult PT Treatment:                                                DATE: 06/03/24 Therapeutic Exercise: Supine Heel Slide s and c Strap x10, x5 30 Supine Quad Set  x10 10 AA SLR c QS x10 LAQ 3x10 STS 2x10, favored L knee Standing Heel raise/toe lifts 2x10 Therapeutic Activity: Nustep L4 c progressive flexion Gait training with SPC  SL standing L LE c hand A as needed  PATIENT EDUCATION ([place]/[facility]):  POC, diagnosis, prognosis, HEP, and outcome measures.  Pt educated via explanation, demonstration, and handout (HEP).  Pt  confirms understanding verbally.   ASSESSMENT: CLINICAL IMPRESSION: Pt was not able participate in standing exs due sig increased R knee pain (10/10) and swelling. PT was completed for L knee/leg ROM and strengthening. Pt's AAROM of the L knee continues to make gradual progress. Pt tolerated prescribed exs without adverse effects. Pt will continue to benefit from skilled PT to address impairments for improved L knee/LE function c minimized pain.   EVAL: Roque is a 59 y.o. male who presents to clinic with signs and sxs consistent with L knee pain and stiffness following L TKA on 12/15.  Doing fair overall with expected pain and ROM limitations.   Patrick will benefit from skilled PT to address relevant deficits to achieve independence with daily activities.   OBJECTIVE IMPAIRMENTS: Pain, L knee ROM, L knee and hip strength, gait, balance  ACTIVITY LIMITATIONS: standing, walking, squatting, work  PERSONAL FACTORS: See medical history and pertinent history   REHAB POTENTIAL: Good  CLINICAL DECISION MAKING: Evolving/moderate complexity  EVALUATION COMPLEXITY: Moderate   GOALS:   SHORT TERM GOALS: Target date: 06/22/2024   L[place] will be >75% HEP compliant to improve carryover between sessions and facilitate independent management of condition  Evaluation: ongoing Goal status: MET    LONG TERM GOALS: Target date: 07/20/2024   Pape will self report >/= 50% decrease in pain from evaluation to improve function in daily tasks  Evaluation/Baseline: 8/10 max pain Goal status: INITIAL   2.  [place] will show a >/= 27 pt improvement in LEFS score (MCID is ~11% or 9 pts) as a proxy for functional improvement   Evaluation/Baseline: 21 pts Goal status: INITIAL   3.  [place] will be able to return to work, not limited by pain  Evaluation/Baseline: limited Goal status: INITIAL   4.  [place] will achieve >/=115 degrees knee flexion to improve ability to complete transfers, squat, and navigate  steps  Evaluation/Baseline: 70 degrees Goal status: INITIAL   5.  [place] will achieve </=3 degrees knee extension to improve mechanics of gait  Evaluation/Baseline: 11 degrees Goal status: INITIAL    PLAN: PT FREQUENCY: 1-2x/week  PT DURATION: 8 weeks  PLANNED INTERVENTIONS:  [id number]- PT Re-evaluation, 97110-Therapeutic exercises, 97530- Therapeutic activity, 97112- Neuromuscular re-education, 97535- Self Care, [id number]- Manual therapy, [id number]- Gait training, [id number]- Aquatic Therapy, [id number]- Electrical stimulation (manual), [id number]- Vasopneumatic device, [id number]- Traction (mechanical), [id number]- Ionotophoresis 4mg /ml Dexamethasone , Taping, Dry Needling, Joint manipulation, and Spinal manipulation.  Jacara Benito MS, PT 06/10/24 10:20 PM   "

## 2024-06-11 ENCOUNTER — Ambulatory Visit

## 2024-06-11 DIAGNOSIS — M6281 Muscle weakness (generalized): Secondary | ICD-10-CM

## 2024-06-11 DIAGNOSIS — M25562 Pain in left knee: Secondary | ICD-10-CM

## 2024-06-11 DIAGNOSIS — R2681 Unsteadiness on feet: Secondary | ICD-10-CM

## 2024-06-12 ENCOUNTER — Ambulatory Visit (HOSPITAL_BASED_OUTPATIENT_CLINIC_OR_DEPARTMENT_OTHER): Admitting: Student

## 2024-06-12 DIAGNOSIS — M1711 Unilateral primary osteoarthritis, right knee: Secondary | ICD-10-CM

## 2024-06-12 MED ORDER — LIDOCAINE HCL 1 % IJ SOLN
4.0000 mL | INTRAMUSCULAR | Status: AC | PRN
  Administered 2024-06-12: 4 mL

## 2024-06-12 MED ORDER — BETAMETHASONE SOD PHOS & ACET 6 (3-3) MG/ML IJ SUSP
2.0000 mL | INTRAMUSCULAR | Status: AC | PRN
  Administered 2024-06-12: 2 mL via INTRA_ARTICULAR

## 2024-06-12 NOTE — Progress Notes (Signed)
 "                                Chief Complaint: Right knee pain     History of Present Illness:   06/12/24: Alex Holt is here today for evaluation of right knee pain.  He has been here many times previously for bilateral knee osteoarthritis and recently underwent a left total knee arthroplasty with Dr. Jerri on 05/04/2024.  He states that the left knee is doing well and he is working with physical therapy, however his right knee has become more painful over the past few days and is limiting him in terms of his rehab.  Last injection of the right knee was July 2025.  Surgical History:   None  PMH/PSH/Family History/Social History/Meds/Allergies:    Past Medical History:  Diagnosis Date   Acid reflux    Arthritis    Diabetes mellitus without complication (HCC)    Hyperlipidemia    Hypertension    Schizophrenia (HCC)    Past Surgical History:  Procedure Laterality Date   NO PAST SURGERIES     TOTAL KNEE ARTHROPLASTY Left 05/04/2024   Procedure: ARTHROPLASTY, KNEE, TOTAL;  Surgeon: Jerri Kay HERO, MD;  Location: [facility] OR;  Service: Orthopedics;  Laterality: Left;   Social History   Socioeconomic History   Marital status: Married    Spouse name: Not on file   Number of children: 2   Years of education: Not on file   Highest education level: Not on file  Occupational History   Occupation: sELF EMPLOYED     Comment: [employer]   Tobacco Use   Smoking status: Never   Smokeless tobacco: Never  Vaping Use   Vaping status: Never Used  Substance and Sexual Activity   Alcohol use: Not Currently    Comment: Stopped drinked in Sept '24   Drug use: No   Sexual activity: Yes  Other Topics Concern   Not on file  Social History Narrative   Not on file   Social Drivers of Health   Tobacco Use: Low Risk (06/11/2024)   Patient History    Smoking Tobacco Use: Never    Smokeless Tobacco Use: Never    Passive Exposure: Not on file  Financial Resource Strain: Not on File  (09/07/2021)   Received from [employer]    Financial Resource Strain: 0  Food Insecurity: Not at Risk (06/18/2023)   Received from [facility] Insecurity    Within the past 12 months, you worried that your food would run out before you got money to buy more.: 1  Transportation Needs: Not at Risk (06/18/2023)   Received from [facility] Needs    In the past 12 months, has lack of transportation kept you from medical appointments, meetings, work or from getting things needed for daily living?: 1  Physical Activity: Not on File (09/07/2021)   Received from [facility]   Physical Activity    Physical Activity: 0  Stress: Not on File (09/07/2021)   Received from [facility]   Stress    Stress: 0  Social Connections: Not on File (02/11/2023)   Received from [facility]   Social Connections    Connectedness: 0  Depression (PHQ2-9): Not on file  Alcohol Screen: Not on file  Housing: Not on file  Utilities: Not on file  Health Literacy: Not on file   Family History  Problem Relation Age of Onset   Mental illness Mother    Mental illness Other        FAMILY HISTORY    Diabetes Other    No Known Allergies Current Outpatient Medications  Medication Sig Dispense Refill   amLODipine  (NORVASC ) 10 MG tablet Take 1 tablet by mouth daily.     apixaban  (ELIQUIS ) 2.5 MG TABS tablet Take one tablet by mouth twice daily for 30 days after surgery to prevent blood clots 60 tablet 0   atorvastatin  (LIPITOR) 20 MG tablet Take 1 tablet (20 mg total) by mouth at bedtime. For cholesterol (Patient taking differently: Take 20 mg by mouth daily.) 30 tablet 6   cetirizine  (ZYRTEC ) 10 MG tablet Take 1 tablet (10 mg total) by mouth daily. 30 tablet 0   ciclopirox  (PENLAC ) 8 % solution Apply topically at bedtime. Apply over nail and surrounding skin. Apply daily over previous coat. After seven (7) days, may remove with alcohol and continue cycle. 6.6 mL 0   docusate sodium  (COLACE) 100 MG capsule  Take 1 capsule (100 mg total) by mouth daily as needed. (Patient not taking: Reported on 05/04/2024) 30 capsule 2   doxycycline  (VIBRAMYCIN ) 100 MG capsule Take 1 capsule (100 mg total) by mouth 2 (two) times daily. To be taken after surgery (Patient not taking: Reported on 05/04/2024) 20 capsule 0   fluticasone  (FLONASE ) 50 MCG/ACT nasal spray Place 1 spray into both nostrils daily. Begin by using 2 sprays in each nare daily for 3 to 5 days, then decrease to 1 spray in each nare daily. (Patient taking differently: Place 1-2 sprays into both nostrils daily as needed for allergies or rhinitis.) 15.8 mL 2   hydrochlorothiazide  (HYDRODIURIL ) 50 MG tablet Take 50 mg by mouth daily.     metFORMIN  (GLUCOPHAGE ) 500 MG tablet Take 500 mg by mouth 2 (two) times daily.     methocarbamol  (ROBAXIN ) 500 MG tablet Take 1 tablet (500 mg total) by mouth 2 (two) times daily as needed. 20 tablet 2   ondansetron  (ZOFRAN ) 4 MG tablet Take 1 tablet (4 mg total) by mouth every 8 (eight) hours as needed for nausea or vomiting. (Patient not taking: Reported on 05/04/2024) 40 tablet 0   Semaglutide,0.25 or 0.5MG /DOS, (OZEMPIC, 0.25 OR 0.5 MG/DOSE,) 2 MG/3ML SOPN Inject 0.5 mg into the skin once a week. (Patient not taking: Reported on 05/04/2024)     triamcinolone  cream (KENALOG ) 0.1 % Apply 1 Application topically 2 (two) times daily. 30 g 0   valsartan (DIOVAN) 80 MG tablet Take 80 mg by mouth daily.     No current facility-administered medications for this visit.   No results found.  Review of Systems:   A ROS was performed including pertinent positives and negatives as documented in the HPI.  Physical Exam :   Constitutional: NAD and appears stated age Neurological: Alert and oriented Psych: Appropriate affect and cooperative There were no vitals taken for this visit.   Comprehensive Musculoskeletal Exam:    Exam of the right knee demonstrates presence of a mild to moderate effusion without overlying erythema  or warmth.  Tenderness over bilateral joint lines and patella.  Active range of motion from 5 to 110 degrees.  No instability with varus or valgus stress.  Imaging:    Assessment:   59 y.o. male with increased right knee pain following a left total knee replacement just over 1 month ago.  He does have at least moderate osteoarthritis in the right knee  on previous x-rays and I do suspect that increased pain is due to compensation while recovering from left knee surgery.  Offered an aspiration and cortisone injection of the right knee today for symptomatic relief.  I was able to aspirate 28 cc of clear synovial fluid.  Low suspicion for gout given fluid appearance and that he does not have a confirmed history of this.  He can continue with physical therapy and has a follow-up scheduled with Morna Grave, PA-C next week for the left knee.  Plan :    - Right knee aspiration and injection performed today    Procedure Note  Patient: Alex Holt             Date of Birth: [birth year]           MRN: [id number]             Visit Date: 06/12/2024  Procedures: Visit Diagnoses:  1. Primary osteoarthritis of right knee     Large Joint Inj: R knee on 06/12/2024 4:08 PM Indications: pain Details: 18 G 1.5 in needle, superolateral approach Medications: 4 mL lidocaine  1 %; 2 mL betamethasone acetate-betamethasone sodium phosphate  6 (3-3) MG/ML Aspirate: 28 mL clear Outcome: tolerated well, no immediate complications Procedure, treatment alternatives, risks and benefits explained, specific risks discussed. Consent was given by the patient. Immediately prior to procedure a time out was called to verify the correct patient, procedure, equipment, support staff and site/side marked as required. Patient was prepped and draped in the usual sterile fashion.       I personally saw and evaluated the patient, and participated in the management and treatment plan.   Leonce Reveal, PA-C Orthopedics "

## 2024-06-16 ENCOUNTER — Other Ambulatory Visit: Payer: Self-pay

## 2024-06-16 ENCOUNTER — Ambulatory Visit: Admitting: Physician Assistant

## 2024-06-16 ENCOUNTER — Ambulatory Visit

## 2024-06-16 DIAGNOSIS — Z96652 Presence of left artificial knee joint: Secondary | ICD-10-CM

## 2024-06-16 MED ORDER — HYDROCODONE-ACETAMINOPHEN 5-325 MG PO TABS: 1.0000 | ORAL_TABLET | Freq: Two times a day (BID) | ORAL | 0 refills | Status: AC | PRN

## 2024-06-16 NOTE — Progress Notes (Signed)
 "  Post-Op Visit Note   Patient: Alex Holt           Date of Birth: September 10, 1965           MRN: [id number] Visit Date: 06/16/2024 PCP: Myra Kawasaki, NP (Inactive)   Assessment & Plan:  Chief Complaint:  Chief Complaint  Patient presents with   Left Knee - Routine Post Op   Visit Diagnoses:  1. Status post total left knee replacement     Plan: Patient is a pleasant 59 year old gentleman who comes in today 6 weeks status post left total knee replacement.  He has been doing relatively well.  He is unsure when he stopped taking his aspirin.  He is currently not taking anything for pain although he does need something.  He has been in physical therapy and making steady progress.  Unfortunately, some of his progress has been limited due to increased right knee pain and swelling.  He recently saw Leonce feathers, PA where his right knee was aspirated and injected with cortisone.  He has noticed some relief there.  Examination of the left knee reveals range of motion from 0 to 95 degrees.  He is stable to valgus varus stress.  His neurovascular intact distally.  Calves are soft nontender.  At this point, he will continue to push things with outpatient physical therapy as well as at home.  I sent in Norco to take for pain.  He will follow-up in 6 weeks for repeat evaluation and 2 view x-rays of the left knee as we did not obtain these today.  Call with concerns or questions.  Follow-Up Instructions: Return in about 6 weeks (around 07/28/2024).   Orders:  Orders Placed This Encounter  Procedures   XR Knee 1-2 Views Left   Meds ordered this encounter  Medications   HYDROcodone -acetaminophen  (NORCO/VICODIN) 5-325 MG tablet    Sig: Take 1-2 tablets by mouth 2 (two) times daily as needed.    Dispense:  28 tablet    Refill:  0    Imaging: No new imaging  PMFS History: Patient Active Problem List   Diagnosis Date Noted   Status post total left knee replacement 05/04/2024   Primary  osteoarthritis of left knee 01/25/2022   Allergic rhinitis 12/23/2013   Flea bite of multiple sites 12/23/2013   Left ankle sprain 08/17/2013   CTS (carpal tunnel syndrome) 04/29/2012   Plantar fasciitis 04/10/2012   Insomnia 11/14/2010   Type 2 diabetes mellitus in patient with obesity (HCC) 11/14/2010   Chronic mental illness 09/25/2010   ANEMIA 03/08/2010   GERD 03/08/2010   HYPERLIPIDEMIA 01/30/2010   OBESITY 01/30/2010   Essential hypertension 01/30/2010   Past Medical History:  Diagnosis Date   Acid reflux    Arthritis    Diabetes mellitus without complication (HCC)    Hyperlipidemia    Hypertension    Schizophrenia (HCC)     Family History  Problem Relation Age of Onset   Mental illness Mother    Mental illness Other        FAMILY HISTORY    Diabetes Other     Past Surgical History:  Procedure Laterality Date   NO PAST SURGERIES     TOTAL KNEE ARTHROPLASTY Left 05/04/2024   Procedure: ARTHROPLASTY, KNEE, TOTAL;  Surgeon: Jerri Kay HERO, MD;  Location: [facility] OR;  Service: Orthopedics;  Laterality: Left;   Social History   Occupational History   Occupation: sELF EMPLOYED     Comment: [employer]   Tobacco Use   Smoking status: Never   Smokeless tobacco: Never  Vaping Use   Vaping status: Never Used  Substance and Sexual Activity   Alcohol use: Not Currently    Comment: Stopped drinked in Sept '24   Drug use: No   Sexual activity: Yes     "

## 2024-06-16 NOTE — Therapy (Incomplete)
 " OUTPATIENT PHYSICAL THERAPY LOWER EXTREMITY TREATMENT  Patient Name: Alex Holt MRN: [id number] DOB:01/16/1966, 59 y.o., male Today's Date: 06/16/2024       Past Medical History:  Diagnosis Date   Acid reflux    Arthritis    Diabetes mellitus without complication (HCC)    Hyperlipidemia    Hypertension    Schizophrenia (HCC)    Past Surgical History:  Procedure Laterality Date   NO PAST SURGERIES     TOTAL KNEE ARTHROPLASTY Left 05/04/2024   Procedure: ARTHROPLASTY, KNEE, TOTAL;  Surgeon: Jerri Kay HERO, MD;  Location: [facility] OR;  Service: Orthopedics;  Laterality: Left;   Patient Active Problem List   Diagnosis Date Noted   Status post total left knee replacement 05/04/2024   Primary osteoarthritis of left knee 01/25/2022   Allergic rhinitis 12/23/2013   Flea bite of multiple sites 12/23/2013   Left ankle sprain 08/17/2013   CTS (carpal tunnel syndrome) 04/29/2012   Plantar fasciitis 04/10/2012   Insomnia 11/14/2010   Type 2 diabetes mellitus in patient with obesity (HCC) 11/14/2010   Chronic mental illness 09/25/2010   ANEMIA 03/08/2010   GERD 03/08/2010   HYPERLIPIDEMIA 01/30/2010   OBESITY 01/30/2010   Essential hypertension 01/30/2010    PCP: Myra Kawasaki, NP (Inactive)  REFERRING PROVIDER: Jerri Kay HERO, MD  THERAPY DIAG:  No diagnosis found.  REFERRING DIAG: Primary osteoarthritis of left knee [M17.12]   Rationale for Evaluation and Treatment:  Rehabilitation  SUBJECTIVE:  PERTINENT PAST HISTORY:  DM TII, insomnia, schizophrenia        PRECAUTIONS: None  WEIGHT BEARING RESTRICTIONS No  FALLS:  Has patient fallen in last 6 months? No, Number of falls: 0  MOI/History of condition:  Onset date: 05/04/2024  SUBJECTIVE STATEMENT Pt reports last week his R knee started hurting and swelling which has significantly affeted his ability to walk. Pt endorses completing his HEP daily  EVAL: Pt is a 59 y.o. male who presents to clinic with chief  complaint of L knee pain and stiffness following L TKA 05/04/2024.  Pain in bil knees for about 1 year.  Been doing ok but quite a bit of pain.    Pain:  Are you having pain? Yes Pain location: L knee pain NPRS scale: Current: 5/10. R 10/10 Best: 4/10, Worst: 8/10 Aggravating factors: walking, standing Relieving factors: rest Pain description: sharp and aching  Occupation: washes cars  Assistive Device: FWW  Hand Dominance: na  Patient Goals/Specific Activities: reduce pain and improve function    OBJECTIVE:   GENERAL OBSERVATION/GAIT: Slow antalgic gait with reduced time in stance on L  PALPATION: Mod swelling about the L knee  LE MMT:  MMT Right (Eval) Left (Eval)  Hip flexion (L2, L3) 4 3  Knee extension (L3) 4 3  Knee flexion 4 3  Hip abduction    Hip extension    Hip external rotation    Hip internal rotation    Hip adduction    Ankle dorsiflexion (L4)    Ankle plantarflexion (S1)    Ankle inversion    Ankle eversion    Great Toe ext (L5)    Grossly     (Blank rows = not tested, score listed is out of 5 possible points.  N = WNL, D = diminished, C = clear for gross weakness with myotome testing, * = concordant pain with testing)  LE ROM:  ROM Right (Eval) Left (Eval) Lt Lt 06/03/24 Lt   Hip flexion  Hip extension       Hip abduction       Hip adduction       Hip internal rotation       Hip external rotation       Knee extension n Lacking 11 Lacking 5 Lacking 3 Lacking 3  Knee flexion n 70 85 90 95 AAROM  Ankle dorsiflexion       Ankle plantarflexion       Ankle inversion       Ankle eversion        (Blank rows = not tested, N = WNL, * = concordant pain with testing)  Functional Tests  Eval                                                              PATIENT SURVEYS:  LEFS: 21/80  HOME EXERCISE PROGRAM: Access Code: [email or web address] URL: [email or web address] Date: 06/11/2024 Prepared by: Dasie Daft  Exercises - Supine Ankle Pumps  - 8 x daily - 7 x weekly - 2 sets - 10 reps - Supine Heel Slide with Strap  - 1 x daily - 7 x weekly - 3 sets - 10 reps - Seated Hamstring Stretch  - 2 x daily - 7 x weekly - 1 sets - 3 reps - 45 hold - Active Straight Leg Raise with Quad Set  - 1 x daily - 7 x weekly - 3 sets - 10 reps - Supine Quad Set  - 5 x daily - 7 x weekly - 2 sets - 10 reps - 10 hold - Ice  - 5 x daily - 7 x weekly - 1 sets - 1 reps - 20 min hold - Seated Long Arc Quad  - 1 x daily - 7 x weekly - 2 sets - 10 reps - 3 hold - Sit to Stand Without Arm Support  - 1 x daily - 7 x weekly - 2 sets - 10 reps - Heel Toe Raises with Counter Support  - 1 x daily - 7 x weekly - 2 sets - 10 reps - 2 hold - Sidelying Hip Abduction  - 1 x daily - 7 x weekly - 3 sets - 10 reps - 3 hold - Clamshell with Resistance  - 1 x daily - 7 x weekly - 3 sets - 10 reps - 3 hold - Seated Heel Slide  - 2 x daily - 7 x weekly - 1 sets - 3 reps - 60 hold  Treatment priorities   Eval        Ext and flexion ROM        Gait, balance                                  TODAY'S TREATMENT: [facility] Adult PT Treatment:                                                DATE: 06/17/24 Therapeutic Exercise: Supine Heel Slide s and c Strap x10, x5 30 Supine Quad Set  x10 10 AA SLR c QS x10  SAQ 3x10 5# S/L hip abd 3x10 5# S/L clams 3x10 BluTB Seated knee flexion c over pressure Therapeutic Exercise: *** Manual Therapy: *** Neuromuscular re-ed: *** Therapeutic Activity: *** Modalities: *** Self Care: ***  [facility] Adult PT Treatment:                                                DATE: 06/09/24 Therapeutic Exercise: Supine Heel Slide s and c Strap x10, x5 30 Supine Quad Set  x10 10 AA SLR c QS x10 SAQ 3x10 5# S/L hip abd 3x10 5# S/L clams 3x10 BluTB Seated knee flexion c over pressure  PATIENT EDUCATION ([place]/[facility]):  POC, diagnosis, prognosis, HEP, and outcome measures.  Pt educated via explanation,  demonstration, and handout (HEP).  Pt confirms understanding verbally.   ASSESSMENT: CLINICAL IMPRESSION: Pt was not able participate in standing exs due sig increased R knee pain (10/10) and swelling. PT was completed for L knee/leg ROM and strengthening. Pt's AAROM of the L knee continues to make gradual progress. Pt tolerated prescribed exs without adverse effects. Pt will continue to benefit from skilled PT to address impairments for improved L knee/LE function c minimized pain.   EVAL: Alex Holt is a 59 y.o. male who presents to clinic with signs and sxs consistent with L knee pain and stiffness following L TKA on 12/15.  Doing fair overall with expected pain and ROM limitations.   Alex Holt will benefit from skilled PT to address relevant deficits to achieve independence with daily activities.   OBJECTIVE IMPAIRMENTS: Pain, L knee ROM, L knee and hip strength, gait, balance  ACTIVITY LIMITATIONS: standing, walking, squatting, work  PERSONAL FACTORS: See medical history and pertinent history   REHAB POTENTIAL: Good  CLINICAL DECISION MAKING: Evolving/moderate complexity  EVALUATION COMPLEXITY: Moderate   GOALS:   SHORT TERM GOALS: Target date: 06/22/2024   Alex Holt will be >75% HEP compliant to improve carryover between sessions and facilitate independent management of condition  Evaluation: ongoing Goal status: MET    LONG TERM GOALS: Target date: 07/20/2024   Alex Holt will self report >/= 50% decrease in pain from evaluation to improve function in daily tasks  Evaluation/Baseline: 8/10 max pain Goal status: INITIAL   2.  Alex Holt will show a >/= 27 pt improvement in LEFS score (MCID is ~11% or 9 pts) as a proxy for functional improvement   Evaluation/Baseline: 21 pts Goal status: INITIAL   3.  Alex Holt will be able to return to work, not limited by pain  Evaluation/Baseline: limited Goal status: INITIAL   4.  Alex Holt will achieve >/=115 degrees knee flexion to improve ability to  complete transfers, squat, and navigate steps  Evaluation/Baseline: 70 degrees Goal status: INITIAL   5.  Alex Holt will achieve </=3 degrees knee extension to improve mechanics of gait  Evaluation/Baseline: 11 degrees Goal status: INITIAL    PLAN: PT FREQUENCY: 1-2x/week  PT DURATION: 8 weeks  PLANNED INTERVENTIONS:  [id number]- PT Re-evaluation, 97110-Therapeutic exercises, 97530- Therapeutic activity, 97112- Neuromuscular re-education, 97535- Self Care, [id number]- Manual therapy, [id number]- Gait training, [id number]- Aquatic Therapy, [phone]- Electrical stimulation (manual), [id number]- Vasopneumatic device, [id number]- Traction (mechanical), [id number]- Ionotophoresis 4mg /ml Dexamethasone , Taping, Dry Needling, Joint manipulation, and Spinal manipulation.  Ari Bernabei MS, PT 06/16/24 3:12 PM   "

## 2024-06-17 ENCOUNTER — Ambulatory Visit

## 2024-06-17 DIAGNOSIS — M25562 Pain in left knee: Secondary | ICD-10-CM

## 2024-06-17 DIAGNOSIS — R2681 Unsteadiness on feet: Secondary | ICD-10-CM

## 2024-06-17 DIAGNOSIS — M6281 Muscle weakness (generalized): Secondary | ICD-10-CM

## 2024-06-17 NOTE — Therapy (Incomplete)
 " OUTPATIENT PHYSICAL THERAPY LOWER EXTREMITY TREATMENT  Patient Name: Alex Holt MRN: [id number] DOB:[birth year], 59 y.o., male Today's Date: 06/17/2024       Past Medical History:  Diagnosis Date   Acid reflux    Arthritis    Diabetes mellitus without complication (HCC)    Hyperlipidemia    Hypertension    Schizophrenia (HCC)    Past Surgical History:  Procedure Laterality Date   NO PAST SURGERIES     TOTAL KNEE ARTHROPLASTY Left 05/04/2024   Procedure: ARTHROPLASTY, KNEE, TOTAL;  Surgeon: Jerri Kay HERO, MD;  Location: [facility] OR;  Service: Orthopedics;  Laterality: Left;   Patient Active Problem List   Diagnosis Date Noted   Status post total left knee replacement 05/04/2024   Primary osteoarthritis of left knee 01/25/2022   Allergic rhinitis 12/23/2013   Flea bite of multiple sites 12/23/2013   Left ankle sprain 08/17/2013   CTS (carpal tunnel syndrome) 04/29/2012   Plantar fasciitis 04/10/2012   Insomnia 11/14/2010   Type 2 diabetes mellitus in patient with obesity (HCC) 11/14/2010   Chronic mental illness 09/25/2010   ANEMIA 03/08/2010   GERD 03/08/2010   HYPERLIPIDEMIA 01/30/2010   OBESITY 01/30/2010   Essential hypertension 01/30/2010    PCP: Myra Kawasaki, NP (Inactive)  REFERRING PROVIDER: Jerri Kay HERO, MD  THERAPY DIAG:  No diagnosis found.  REFERRING DIAG: Primary osteoarthritis of left knee [M17.12]   Rationale for Evaluation and Treatment:  Rehabilitation  SUBJECTIVE:  PERTINENT PAST HISTORY:  DM TII, insomnia, schizophrenia        PRECAUTIONS: None  WEIGHT BEARING RESTRICTIONS No  FALLS:  Has patient fallen in last 6 months? No, Number of falls: 0  MOI/History of condition:  Onset date: 05/04/2024  SUBJECTIVE STATEMENT Pt reports last week his R knee started hurting and swelling which has significantly affeted his ability to walk. Pt endorses completing his HEP daily  EVAL: Pt is a 59 y.o. male who presents to clinic with chief  complaint of L knee pain and stiffness following L TKA 05/04/2024.  Pain in bil knees for about 1 year.  Been doing ok but quite a bit of pain.    Pain:  Are you having pain? Yes Pain location: L knee pain NPRS scale: Current: 5/10. R 10/10 Best: 4/10, Worst: 8/10 Aggravating factors: walking, standing Relieving factors: rest Pain description: sharp and aching  Occupation: washes cars  Assistive Device: FWW  Hand Dominance: na  Patient Goals/Specific Activities: reduce pain and improve function    OBJECTIVE:   GENERAL OBSERVATION/GAIT: Slow antalgic gait with reduced time in stance on L  PALPATION: Mod swelling about the L knee  LE MMT:  MMT Right (Eval) Left (Eval)  Hip flexion (L2, L3) 4 3  Knee extension (L3) 4 3  Knee flexion 4 3  Hip abduction    Hip extension    Hip external rotation    Hip internal rotation    Hip adduction    Ankle dorsiflexion (L4)    Ankle plantarflexion (S1)    Ankle inversion    Ankle eversion    Great Toe ext (L5)    Grossly     (Blank rows = not tested, score listed is out of 5 possible points.  N = WNL, D = diminished, C = clear for gross weakness with myotome testing, * = concordant pain with testing)  LE ROM:  ROM Right (Eval) Left (Eval) Lt Lt 06/03/24 Lt   Hip flexion  Hip extension       Hip abduction       Hip adduction       Hip internal rotation       Hip external rotation       Knee extension n Lacking 11 Lacking 5 Lacking 3 Lacking 3  Knee flexion n 70 85 90 95 AAROM  Ankle dorsiflexion       Ankle plantarflexion       Ankle inversion       Ankle eversion        (Blank rows = not tested, N = WNL, * = concordant pain with testing)  Functional Tests  Eval                                                              PATIENT SURVEYS:  LEFS: 21/80  HOME EXERCISE PROGRAM: Access Code: [email or web address] URL: [email or web address] Date: 06/11/2024 Prepared by: Dasie Daft  Exercises - Supine Ankle Pumps  - 8 x daily - 7 x weekly - 2 sets - 10 reps - Supine Heel Slide with Strap  - 1 x daily - 7 x weekly - 3 sets - 10 reps - Seated Hamstring Stretch  - 2 x daily - 7 x weekly - 1 sets - 3 reps - 45 hold - Active Straight Leg Raise with Quad Set  - 1 x daily - 7 x weekly - 3 sets - 10 reps - Supine Quad Set  - 5 x daily - 7 x weekly - 2 sets - 10 reps - 10 hold - Ice  - 5 x daily - 7 x weekly - 1 sets - 1 reps - 20 min hold - Seated Long Arc Quad  - 1 x daily - 7 x weekly - 2 sets - 10 reps - 3 hold - Sit to Stand Without Arm Support  - 1 x daily - 7 x weekly - 2 sets - 10 reps - Heel Toe Raises with Counter Support  - 1 x daily - 7 x weekly - 2 sets - 10 reps - 2 hold - Sidelying Hip Abduction  - 1 x daily - 7 x weekly - 3 sets - 10 reps - 3 hold - Clamshell with Resistance  - 1 x daily - 7 x weekly - 3 sets - 10 reps - 3 hold - Seated Heel Slide  - 2 x daily - 7 x weekly - 1 sets - 3 reps - 60 hold  Treatment priorities   Eval        Ext and flexion ROM        Gait, balance                                  TODAY'S TREATMENT: [facility] Adult PT Treatment:                                                DATE: 06/17/24 Therapeutic Exercise: Supine Heel Slide s and c Strap x10, x5 30 Supine Quad Set  x10 10 AA SLR c QS x10  SAQ 3x10 5# S/L hip abd 3x10 5# S/L clams 3x10 BluTB Seated knee flexion c over pressure Therapeutic Exercise: *** Manual Therapy: *** Neuromuscular re-ed: *** Therapeutic Activity: *** Modalities: *** Self Care: ***  [facility] Adult PT Treatment:                                                DATE: 06/09/24 Therapeutic Exercise: Supine Heel Slide s and c Strap x10, x5 30 Supine Quad Set  x10 10 AA SLR c QS x10 SAQ 3x10 5# S/L hip abd 3x10 5# S/L clams 3x10 BluTB Seated knee flexion c over pressure  PATIENT EDUCATION ([place]/[facility]):  POC, diagnosis, prognosis, HEP, and outcome measures.  Pt educated via explanation,  demonstration, and handout (HEP).  Pt confirms understanding verbally.   ASSESSMENT: CLINICAL IMPRESSION: Pt was not able participate in standing exs due sig increased R knee pain (10/10) and swelling. PT was completed for L knee/leg ROM and strengthening. Pt's AAROM of the L knee continues to make gradual progress. Pt tolerated prescribed exs without adverse effects. Pt will continue to benefit from skilled PT to address impairments for improved L knee/LE function c minimized pain.   EVAL: Susana is a 59 y.o. male who presents to clinic with signs and sxs consistent with L knee pain and stiffness following L TKA on 12/15.  Doing fair overall with expected pain and ROM limitations.   Samantha will benefit from skilled PT to address relevant deficits to achieve independence with daily activities.   OBJECTIVE IMPAIRMENTS: Pain, L knee ROM, L knee and hip strength, gait, balance  ACTIVITY LIMITATIONS: standing, walking, squatting, work  PERSONAL FACTORS: See medical history and pertinent history   REHAB POTENTIAL: Good  CLINICAL DECISION MAKING: Evolving/moderate complexity  EVALUATION COMPLEXITY: Moderate   GOALS:   SHORT TERM GOALS: Target date: 06/22/2024   Tysin will be >75% HEP compliant to improve carryover between sessions and facilitate independent management of condition  Evaluation: ongoing Goal status: MET    LONG TERM GOALS: Target date: 07/20/2024   Raja will self report >/= 50% decrease in pain from evaluation to improve function in daily tasks  Evaluation/Baseline: 8/10 max pain Goal status: INITIAL   2.  Daton will show a >/= 27 pt improvement in LEFS score (MCID is ~11% or 9 pts) as a proxy for functional improvement   Evaluation/Baseline: 21 pts Goal status: INITIAL   3.  Jammal will be able to return to work, not limited by pain  Evaluation/Baseline: limited Goal status: INITIAL   4.  Mylen will achieve >/=115 degrees knee flexion to improve ability to  complete transfers, squat, and navigate steps  Evaluation/Baseline: 70 degrees Goal status: INITIAL   5.  Presten will achieve </=3 degrees knee extension to improve mechanics of gait  Evaluation/Baseline: 11 degrees Goal status: INITIAL    PLAN: PT FREQUENCY: 1-2x/week  PT DURATION: 8 weeks  PLANNED INTERVENTIONS:  97164- PT Re-evaluation, 97110-Therapeutic exercises, 97530- Therapeutic activity, 97112- Neuromuscular re-education, 97535- Self Care, 02859- Manual therapy, [id number]- Gait training, [id number]- Aquatic Therapy, [phone]- Electrical stimulation (manual), [id number]- Vasopneumatic device, [id number]- Traction (mechanical), [id number]- Ionotophoresis 4mg /ml Dexamethasone , Taping, Dry Needling, Joint manipulation, and Spinal manipulation.  Dredyn Gubbels MS, PT 06/17/24 12:40 PM   "

## 2024-06-17 NOTE — Therapy (Signed)
 " OUTPATIENT PHYSICAL THERAPY LOWER EXTREMITY TREATMENT  Patient Name: Alex Holt MRN: [id number] DOB:1965-10-18, 59 y.o., male Today's Date: 06/17/2024   PT End of Session - 06/17/24 1512     Visit Number 5    Number of Visits --   1-2x/wk   Date for Recertification  07/20/24    Authorization Type [employer]    PT Start Time 1505    PT Stop Time 1550    PT Time Calculation (min) 45 min    Activity Tolerance Patient tolerated treatment well    Behavior During Therapy WFL for tasks assessed/performed             Past Medical History:  Diagnosis Date   Acid reflux    Arthritis    Diabetes mellitus without complication (HCC)    Hyperlipidemia    Hypertension    Schizophrenia (HCC)    Past Surgical History:  Procedure Laterality Date   NO PAST SURGERIES     TOTAL KNEE ARTHROPLASTY Left 05/04/2024   Procedure: ARTHROPLASTY, KNEE, TOTAL;  Surgeon: Jerri Kay HERO, MD;  Location: [facility] OR;  Service: Orthopedics;  Laterality: Left;   Patient Active Problem List   Diagnosis Date Noted   Status post total left knee replacement 05/04/2024   Primary osteoarthritis of left knee 01/25/2022   Allergic rhinitis 12/23/2013   Flea bite of multiple sites 12/23/2013   Left ankle sprain 08/17/2013   CTS (carpal tunnel syndrome) 04/29/2012   Plantar fasciitis 04/10/2012   Insomnia 11/14/2010   Type 2 diabetes mellitus in patient with obesity (HCC) 11/14/2010   Chronic mental illness 09/25/2010   ANEMIA 03/08/2010   GERD 03/08/2010   HYPERLIPIDEMIA 01/30/2010   OBESITY 01/30/2010   Essential hypertension 01/30/2010    PCP: Myra Kawasaki, NP (Inactive)  REFERRING PROVIDER: Jerri Kay HERO, MD  THERAPY DIAG:  Left knee pain, unspecified chronicity  Muscle weakness  Unsteadiness on feet  REFERRING DIAG: Primary osteoarthritis of left knee [M17.12]   Rationale for Evaluation and Treatment:  Rehabilitation  SUBJECTIVE:  PERTINENT PAST HISTORY:  DM TII, insomnia,  schizophrenia        PRECAUTIONS: None  WEIGHT BEARING RESTRICTIONS No  FALLS:  Has patient fallen in last 6 months? No, Number of falls: 0  MOI/History of condition:  Onset date: 05/04/2024  SUBJECTIVE STATEMENT Pt reports having fluid drawn off his L knee last Friday and   EVAL: Pt is a 59 y.o. male who presents to clinic with chief complaint of L knee pain and stiffness following L TKA 05/04/2024.  Pain in bil knees for about 1 year.  Been doing ok but quite a bit of pain.    Pain:  Are you having pain? Yes Pain location: L knee pain NPRS scale: Current: 5/10. R 2/10 Best: 4/10, Worst: 8/10 Aggravating factors: walking, standing Relieving factors: rest Pain description: sharp and aching  Occupation: washes cars  Assistive Device: FWW  Hand Dominance: na  Patient Goals/Specific Activities: reduce pain and improve function    OBJECTIVE:   GENERAL OBSERVATION/GAIT: Slow antalgic gait with reduced time in stance on L  PALPATION: Mod swelling about the L knee  LE MMT:  MMT Right (Eval) Left (Eval)  Hip flexion (L2, L3) 4 3  Knee extension (L3) 4 3  Knee flexion 4 3  Hip abduction    Hip extension    Hip external rotation    Hip internal rotation    Hip adduction    Ankle dorsiflexion (L4)  Ankle plantarflexion (S1)    Ankle inversion    Ankle eversion    Great Toe ext (L5)    Grossly     (Blank rows = not tested, score listed is out of 5 possible points.  N = WNL, D = diminished, C = clear for gross weakness with myotome testing, * = concordant pain with testing)  LE ROM:  ROM Right (Eval) Left (Eval) Lt Lt 06/03/24 Lt  Lt 06/17/24  Hip flexion        Hip extension        Hip abduction        Hip adduction        Hip internal rotation        Hip external rotation        Knee extension n Lacking 11 Lacking 5 Lacking 3 Lacking 3   Knee flexion n 70 85 90 95 AAROM 102 AROM  Ankle dorsiflexion        Ankle plantarflexion        Ankle  inversion        Ankle eversion         (Blank rows = not tested, N = WNL, * = concordant pain with testing)  Functional Tests  Eval                                                              PATIENT SURVEYS:  LEFS: 21/80  HOME EXERCISE PROGRAM: Access Code: [email or web address] URL: [email or web address] Date: 06/11/2024 Prepared by: Dasie Daft  Exercises - Supine Ankle Pumps  - 8 x daily - 7 x weekly - 2 sets - 10 reps - Supine Heel Slide with Strap  - 1 x daily - 7 x weekly - 3 sets - 10 reps - Seated Hamstring Stretch  - 2 x daily - 7 x weekly - 1 sets - 3 reps - 45 hold - Active Straight Leg Raise with Quad Set  - 1 x daily - 7 x weekly - 3 sets - 10 reps - Supine Quad Set  - 5 x daily - 7 x weekly - 2 sets - 10 reps - 10 hold - Ice  - 5 x daily - 7 x weekly - 1 sets - 1 reps - 20 min hold - Seated Long Arc Quad  - 1 x daily - 7 x weekly - 2 sets - 10 reps - 3 hold - Sit to Stand Without Arm Support  - 1 x daily - 7 x weekly - 2 sets - 10 reps - Heel Toe Raises with Counter Support  - 1 x daily - 7 x weekly - 2 sets - 10 reps - 2 hold - Sidelying Hip Abduction  - 1 x daily - 7 x weekly - 3 sets - 10 reps - 3 hold - Clamshell with Resistance  - 1 x daily - 7 x weekly - 3 sets - 10 reps - 3 hold - Seated Heel Slide  - 2 x daily - 7 x weekly - 1 sets - 3 reps - 60 hold  Treatment priorities   Eval        Ext and flexion ROM        Gait, balance  TODAY'S TREATMENT: [facility] Adult PT Treatment:                                                DATE: 06/17/24 Therapeutic Exercise/Activities: Supine Heel Slide s and c Strap x10, x5 30 Supine Quad Set  x10 10 AA SLR c QS x10 LAQ 3x10 5# S/L hip abd 2x10 5# S/L clams 3x10 BluTB Tandem balance standing  SL balance standing L Forward step ups/backs 2x10 L Lateral step ups/lowers 2x10 L  [facility] Adult PT Treatment:                                                DATE:  06/09/24 Therapeutic Exercise: Supine Heel Slide s and c Strap x10, x5 30 Supine Quad Set  x10 10 AA SLR c QS x10 SAQ 3x10 5# S/L hip abd 3x10 5# S/L clams 3x10 BluTB Seated knee flexion c over pressure  PATIENT EDUCATION (/HM):  POC, diagnosis, prognosis, HEP, and outcome measures.  Pt educated via explanation, demonstration, and handout (HEP).  Pt confirms understanding verbally.   ASSESSMENT: CLINICAL IMPRESSION: Pt's R knee is better following removal of fluid and injection. Pt participated in ROM and strengthening exs and balance activities. The R knee was able to tolerated 2 footed stance activities. L knee flexion AROM continues to make appropriate gains.   EVAL: Wagner is a 59 y.o. male who presents to clinic with signs and sxs consistent with L knee pain and stiffness following L TKA on 12/15.  Doing fair overall with expected pain and ROM limitations.   Myrtle will benefit from skilled PT to address relevant deficits to achieve independence with daily activities.   OBJECTIVE IMPAIRMENTS: Pain, L knee ROM, L knee and hip strength, gait, balance  ACTIVITY LIMITATIONS: standing, walking, squatting, work  PERSONAL FACTORS: See medical history and pertinent history   REHAB POTENTIAL: Good  CLINICAL DECISION MAKING: Evolving/moderate complexity  EVALUATION COMPLEXITY: Moderate   GOALS:   SHORT TERM GOALS: Target date: 06/22/2024   Rien will be >75% HEP compliant to improve carryover between sessions and facilitate independent management of condition  Evaluation: ongoing Goal status: MET    LONG TERM GOALS: Target date: 07/20/2024   Tajuan will self report >/= 50% decrease in pain from evaluation to improve function in daily tasks  Evaluation/Baseline: 8/10 max pain Goal status: INITIAL   2.  Esley will show a >/= 27 pt improvement in LEFS score (MCID is ~11% or 9 pts) as a proxy for functional improvement   Evaluation/Baseline: 21 pts Goal status:  INITIAL   3.  Mihailo will be able to return to work, not limited by pain  Evaluation/Baseline: limited Goal status: INITIAL   4.  Minoru will achieve >/=115 degrees knee flexion to improve ability to complete transfers, squat, and navigate steps  Evaluation/Baseline: 70 degrees Goal status: INITIAL   5.  Treyvon will achieve </=3 degrees knee extension to improve mechanics of gait  Evaluation/Baseline: 11 degrees Goal status: INITIAL    PLAN: PT FREQUENCY: 1-2x/week  PT DURATION: 8 weeks  PLANNED INTERVENTIONS:  [id number]- PT Re-evaluation, 97110-Therapeutic exercises, 97530- Therapeutic activity, [id number]- Neuromuscular re-education, 97535- Self Care, 02859- Manual therapy, [id number]- Gait training, [id number]- Aquatic Therapy, [phone]-  Electrical stimulation (manual), 02983- Vasopneumatic device, [id number]- Traction (mechanical), 02966- Ionotophoresis 4mg /ml Dexamethasone , Taping, Dry Needling, Joint manipulation, and Spinal manipulation.  Garland Smouse MS, PT 06/17/24 4:43 PM   "

## 2024-06-18 ENCOUNTER — Ambulatory Visit

## 2024-06-18 DIAGNOSIS — M6281 Muscle weakness (generalized): Secondary | ICD-10-CM

## 2024-06-18 DIAGNOSIS — M25562 Pain in left knee: Secondary | ICD-10-CM

## 2024-06-18 DIAGNOSIS — R2681 Unsteadiness on feet: Secondary | ICD-10-CM

## 2024-06-18 NOTE — Therapy (Signed)
 " OUTPATIENT PHYSICAL THERAPY LOWER EXTREMITY TREATMENT  Patient Name: Alex Holt MRN: [id number] DOB:1966/02/21, 59 y.o., male Today's Date: 06/18/2024   PT End of Session - 06/18/24 1337     Visit Number 6    Number of Visits --   1-2x per week   Date for Recertification  07/20/24    Authorization Type [employer]    PT Start Time 1337    PT Stop Time 1420    PT Time Calculation (min) 43 min    Activity Tolerance Patient tolerated treatment well    Behavior During Therapy WFL for tasks assessed/performed             Past Medical History:  Diagnosis Date   Acid reflux    Arthritis    Diabetes mellitus without complication (HCC)    Hyperlipidemia    Hypertension    Schizophrenia (HCC)    Past Surgical History:  Procedure Laterality Date   NO PAST SURGERIES     TOTAL KNEE ARTHROPLASTY Left 05/04/2024   Procedure: ARTHROPLASTY, KNEE, TOTAL;  Surgeon: Jerri Kay HERO, MD;  Location: [facility] OR;  Service: Orthopedics;  Laterality: Left;   Patient Active Problem List   Diagnosis Date Noted   Status post total left knee replacement 05/04/2024   Primary osteoarthritis of left knee 01/25/2022   Allergic rhinitis 12/23/2013   Flea bite of multiple sites 12/23/2013   Left ankle sprain 08/17/2013   CTS (carpal tunnel syndrome) 04/29/2012   Plantar fasciitis 04/10/2012   Insomnia 11/14/2010   Type 2 diabetes mellitus in patient with obesity (HCC) 11/14/2010   Chronic mental illness 09/25/2010   ANEMIA 03/08/2010   GERD 03/08/2010   HYPERLIPIDEMIA 01/30/2010   OBESITY 01/30/2010   Essential hypertension 01/30/2010    PCP: Myra Kawasaki, NP (Inactive)  REFERRING PROVIDER: Jerri Kay HERO, MD  THERAPY DIAG:  Left knee pain, unspecified chronicity  Muscle weakness  Unsteadiness on feet  REFERRING DIAG: Primary osteoarthritis of left knee [M17.12]   Rationale for Evaluation and Treatment:  Rehabilitation  SUBJECTIVE:  PERTINENT PAST HISTORY:  DM TII, insomnia,  schizophrenia        PRECAUTIONS: None  WEIGHT BEARING RESTRICTIONS No  FALLS:  Has patient fallen in last 6 months? No, Number of falls: 0  MOI/History of condition:  Onset date: 05/04/2024  SUBJECTIVE STATEMENT Pt he is doing well today.   EVAL: Pt is a 59 y.o. male who presents to clinic with chief complaint of L knee pain and stiffness following L TKA 05/04/2024.  Pain in bil knees for about 1 year.  Been doing ok but quite a bit of pain.    Pain:  Are you having pain? Yes Pain location: L knee pain NPRS scale: Current: 5/10. R 2/10 Best: 4/10, Worst: 8/10 Aggravating factors: walking, standing Relieving factors: rest Pain description: sharp and aching  Occupation: washes cars  Assistive Device: FWW  Hand Dominance: na  Patient Goals/Specific Activities: reduce pain and improve function    OBJECTIVE:   GENERAL OBSERVATION/GAIT: Slow antalgic gait with reduced time in stance on L  PALPATION: Mod swelling about the L knee  LE MMT:  MMT Right (Eval) Left (Eval)  Hip flexion (L2, L3) 4 3  Knee extension (L3) 4 3  Knee flexion 4 3  Hip abduction    Hip extension    Hip external rotation    Hip internal rotation    Hip adduction    Ankle dorsiflexion (L4)    Ankle plantarflexion (S1)  Ankle inversion    Ankle eversion    Great Toe ext (L5)    Grossly     (Blank rows = not tested, score listed is out of 5 possible points.  N = WNL, D = diminished, C = clear for gross weakness with myotome testing, * = concordant pain with testing)  LE ROM:  ROM Right (Eval) Left (Eval) Lt Lt 06/03/24 Lt  Lt 06/17/24  Hip flexion        Hip extension        Hip abduction        Hip adduction        Hip internal rotation        Hip external rotation        Knee extension n Lacking 11 Lacking 5 Lacking 3 Lacking 3   Knee flexion n 70 85 90 95 AAROM 102 AROM  Ankle dorsiflexion        Ankle plantarflexion        Ankle inversion        Ankle eversion          (Blank rows = not tested, N = WNL, * = concordant pain with testing)  Functional Tests  Eval                                                              PATIENT SURVEYS:  LEFS: 21/80  HOME EXERCISE PROGRAM: Access Code: [email or web address] URL: [email or web address] Date: 06/11/2024 Prepared by: Dasie Daft  Exercises - Supine Ankle Pumps  - 8 x daily - 7 x weekly - 2 sets - 10 reps - Supine Heel Slide with Strap  - 1 x daily - 7 x weekly - 3 sets - 10 reps - Seated Hamstring Stretch  - 2 x daily - 7 x weekly - 1 sets - 3 reps - 45 hold - Active Straight Leg Raise with Quad Set  - 1 x daily - 7 x weekly - 3 sets - 10 reps - Supine Quad Set  - 5 x daily - 7 x weekly - 2 sets - 10 reps - 10 hold - Ice  - 5 x daily - 7 x weekly - 1 sets - 1 reps - 20 min hold - Seated Long Arc Quad  - 1 x daily - 7 x weekly - 2 sets - 10 reps - 3 hold - Sit to Stand Without Arm Support  - 1 x daily - 7 x weekly - 2 sets - 10 reps - Heel Toe Raises with Counter Support  - 1 x daily - 7 x weekly - 2 sets - 10 reps - 2 hold - Sidelying Hip Abduction  - 1 x daily - 7 x weekly - 3 sets - 10 reps - 3 hold - Clamshell with Resistance  - 1 x daily - 7 x weekly - 3 sets - 10 reps - 3 hold - Seated Heel Slide  - 2 x daily - 7 x weekly - 1 sets - 3 reps - 60 hold  Treatment priorities   Eval        Ext and flexion ROM        Gait, balance  TODAY'S TREATMENT: [facility] Adult PT Treatment:                                                DATE: 06/17/24 Rec bike L1 5 mins for L knee ROM Tandem balance standing  SL balance standing L Standing hip abd 2x20x3s 5# Standing hip ext 2x20x3s 5# Alt Side steps over low hurdles Alt FWD steps over low hurdles Omega L knee ext 3x8x3s 5# Omega L knee flex 3x8x3s 15# Gait training with [facility]  [facility] Adult PT Treatment:                                                DATE: 06/17/24 Therapeutic  Exercise/Activities: Supine Heel Slide s and c Strap x10, x5 30 Supine Quad Set  x10 10 AA SLR c QS x10 LAQ 3x10 5# S/L hip abd 2x10 5# S/L clams 3x10 BluTB  Forward step ups/backs 2x10 L Lateral step ups/lowers 2x10 L  PATIENT EDUCATION (Salvador Coupe/HM):  POC, diagnosis, prognosis, HEP, and outcome measures.  Pt educated via explanation, demonstration, and handout (HEP).  Pt confirms understanding verbally.   ASSESSMENT: CLINICAL IMPRESSION: Pt was completed for gait training, balance, and strengthening. With gait training using a SPC, pt completed with proper technique following verbal cueing. Pt is OK to use the [facility] for community mobility. With stepping over a low hurdle with the L LE, some hip abd compensation as needed to clear the hurdle with L knee flexion limitation (currently 102 degrees). Pt will continue to benefit from skilled PT to address impairments for improved L knee/LE function for Ind mobility.   EVAL: Kupono is a 59 y.o. male who presents to clinic with signs and sxs consistent with L knee pain and stiffness following L TKA on 12/15.  Doing fair overall with expected pain and ROM limitations.   Jathniel will benefit from skilled PT to address relevant deficits to achieve independence with daily activities.   OBJECTIVE IMPAIRMENTS: Pain, L knee ROM, L knee and hip strength, gait, balance  ACTIVITY LIMITATIONS: standing, walking, squatting, work  PERSONAL FACTORS: See medical history and pertinent history   REHAB POTENTIAL: Good  CLINICAL DECISION MAKING: Evolving/moderate complexity  EVALUATION COMPLEXITY: Moderate   GOALS:   SHORT TERM GOALS: Target date: 06/22/2024   Juwon will be >75% HEP compliant to improve carryover between sessions and facilitate independent management of condition  Evaluation: ongoing Goal status: MET    LONG TERM GOALS: Target date: 07/20/2024   Tytus will self report >/= 50% decrease in pain from evaluation to improve function in daily  tasks  Evaluation/Baseline: 8/10 max pain Goal status: INITIAL   2.  Tonio will show a >/= 27 pt improvement in LEFS score (MCID is ~11% or 9 pts) as a proxy for functional improvement   Evaluation/Baseline: 21 pts Goal status: INITIAL   3.  Joden will be able to return to work, not limited by pain  Evaluation/Baseline: limited Goal status: INITIAL   4.  Zelma will achieve >/=115 degrees knee flexion to improve ability to complete transfers, squat, and navigate steps  Evaluation/Baseline: 70 degrees Goal status: INITIAL   5.  Burt will achieve </=3 degrees knee extension to improve mechanics of gait  Evaluation/Baseline: 11 degrees Goal status: INITIAL    PLAN: PT FREQUENCY: 1-2x/week  PT DURATION: 8 weeks  PLANNED INTERVENTIONS:  [id number]- PT Re-evaluation, 97110-Therapeutic exercises, 97530- Therapeutic activity, [id number]- Neuromuscular re-education, 97535- Self Care, 02859- Manual therapy, [id number]- Gait training, [id number]- Aquatic Therapy, [phone]- Electrical stimulation (manual), [id number]- Vasopneumatic device, [id number]- Traction (mechanical), [id number]- Ionotophoresis 4mg /ml Dexamethasone , Taping, Dry Needling, Joint manipulation, and Spinal manipulation.  Rainy Rothman MS, PT 06/18/24 2:38 PM   "

## 2024-06-19 ENCOUNTER — Ambulatory Visit (INDEPENDENT_AMBULATORY_CARE_PROVIDER_SITE_OTHER): Admitting: Student

## 2024-06-19 DIAGNOSIS — Z96652 Presence of left artificial knee joint: Secondary | ICD-10-CM

## 2024-06-19 NOTE — Progress Notes (Signed)
 "                                Post Operative Evaluation    Procedure/Date of Surgery: Left total knee arthroplasty 05/04/2024  Interval History:   Patient is 6 weeks status post left TKA with Dr. Jerri.  He has been working with physical therapy and overall reports doing well although states that he does have some fluid in the knee and physical therapy had discussed that this may be limiting his range of motion.  He does have some moderate pain over the supra patellar area although reports no significant increases in pain.  Denies any redness, fever, or chills.   PMH/PSH/Family History/Social History/Meds/Allergies:    Past Medical History:  Diagnosis Date   Acid reflux    Arthritis    Diabetes mellitus without complication (HCC)    Hyperlipidemia    Hypertension    Schizophrenia (HCC)    Past Surgical History:  Procedure Laterality Date   NO PAST SURGERIES     TOTAL KNEE ARTHROPLASTY Left 05/04/2024   Procedure: ARTHROPLASTY, KNEE, TOTAL;  Surgeon: Jerri Kay HERO, MD;  Location: [facility] OR;  Service: Orthopedics;  Laterality: Left;   Social History   Socioeconomic History   Marital status: Married    Spouse name: Not on file   Number of children: 2   Years of education: Not on file   Highest education level: Not on file  Occupational History   Occupation: sELF EMPLOYED     Comment: [employer]   Tobacco Use   Smoking status: Never   Smokeless tobacco: Never  Vaping Use   Vaping status: Never Used  Substance and Sexual Activity   Alcohol use: Not Currently    Comment: Stopped drinked in Sept '24   Drug use: No   Sexual activity: Yes  Other Topics Concern   Not on file  Social History Narrative   Not on file   Social Drivers of Health   Tobacco Use: Low Risk (06/18/2024)   Patient History    Smoking Tobacco Use: Never    Smokeless Tobacco Use: Never    Passive Exposure: Not on file  Financial Resource Strain: Not on File (09/07/2021)   Received from  [employer]    Financial Resource Strain: 0  Food Insecurity: Not at Risk (06/18/2023)   Received from [facility] Insecurity    Within the past 12 months, you worried that your food would run out before you got money to buy more.: 1  Transportation Needs: Not at Risk (06/18/2023)   Received from [facility] Needs    In the past 12 months, has lack of transportation kept you from medical appointments, meetings, work or from getting things needed for daily living?: 1  Physical Activity: Not on File (09/07/2021)   Received from [facility]   Physical Activity    Physical Activity: 0  Stress: Not on File (09/07/2021)   Received from [facility]   Stress    Stress: 0  Social Connections: Not on File (02/11/2023)   Received from [facility]   Social Connections    Connectedness: 0  Depression (PHQ2-9): Not on file  Alcohol Screen: Not on file  Housing: Not on file  Utilities: Not on file  Health Literacy: Not on file   Family History  Problem Relation Age of Onset   Mental illness Mother  Mental illness Other        FAMILY HISTORY    Diabetes Other    Allergies[1] Current Outpatient Medications  Medication Sig Dispense Refill   amLODipine  (NORVASC ) 10 MG tablet Take 1 tablet by mouth daily.     apixaban  (ELIQUIS ) 2.5 MG TABS tablet Take one tablet by mouth twice daily for 30 days after surgery to prevent blood clots 60 tablet 0   atorvastatin  (LIPITOR) 20 MG tablet Take 1 tablet (20 mg total) by mouth at bedtime. For cholesterol (Patient taking differently: Take 20 mg by mouth daily.) 30 tablet 6   cetirizine  (ZYRTEC ) 10 MG tablet Take 1 tablet (10 mg total) by mouth daily. 30 tablet 0   ciclopirox  (PENLAC ) 8 % solution Apply topically at bedtime. Apply over nail and surrounding skin. Apply daily over previous coat. After seven (7) days, may remove with alcohol and continue cycle. 6.6 mL 0   docusate sodium  (COLACE) 100 MG capsule Take 1 capsule (100 mg total) by  mouth daily as needed. (Patient not taking: Reported on 05/04/2024) 30 capsule 2   doxycycline  (VIBRAMYCIN ) 100 MG capsule Take 1 capsule (100 mg total) by mouth 2 (two) times daily. To be taken after surgery (Patient not taking: Reported on 05/04/2024) 20 capsule 0   fluticasone  (FLONASE ) 50 MCG/ACT nasal spray Place 1 spray into both nostrils daily. Begin by using 2 sprays in each nare daily for 3 to 5 days, then decrease to 1 spray in each nare daily. (Patient taking differently: Place 1-2 sprays into both nostrils daily as needed for allergies or rhinitis.) 15.8 mL 2   hydrochlorothiazide  (HYDRODIURIL ) 50 MG tablet Take 50 mg by mouth daily.     HYDROcodone -acetaminophen  (NORCO/VICODIN) 5-325 MG tablet Take 1-2 tablets by mouth 2 (two) times daily as needed. 28 tablet 0   metFORMIN  (GLUCOPHAGE ) 500 MG tablet Take 500 mg by mouth 2 (two) times daily.     methocarbamol  (ROBAXIN ) 500 MG tablet Take 1 tablet (500 mg total) by mouth 2 (two) times daily as needed. 20 tablet 2   ondansetron  (ZOFRAN ) 4 MG tablet Take 1 tablet (4 mg total) by mouth every 8 (eight) hours as needed for nausea or vomiting. (Patient not taking: Reported on 05/04/2024) 40 tablet 0   Semaglutide,0.25 or 0.5MG /DOS, (OZEMPIC, 0.25 OR 0.5 MG/DOSE,) 2 MG/3ML SOPN Inject 0.5 mg into the skin once a week. (Patient not taking: Reported on 05/04/2024)     triamcinolone  cream (KENALOG ) 0.1 % Apply 1 Application topically 2 (two) times daily. 30 g 0   valsartan (DIOVAN) 80 MG tablet Take 80 mg by mouth daily.     No current facility-administered medications for this visit.   No results found.  Review of Systems:   A ROS was performed including pertinent positives and negatives as documented in the HPI.   Musculoskeletal Exam:    There were no vitals taken for this visit.  Left knee incision is well-appearing without evidence of erythema or drainage.  There is no significant warmth to the knee.  Patient is ambulating with  assistance of a walker.  Mild to moderate effusion present.  Approximate active range of motion from 0 to 100 degrees without significant discomfort.  Calf supple and nontender.  Imaging:    Assessment:   Patient is 6 weeks status post left total knee arthroplasty.  He comes in today for evaluation of swelling in his knee which PT had discussed is limiting his range of motion.  On exam he  does have a mild to moderate effusion although no erythema, warmth, increased pain, difficulty weightbearing, or systemic symptoms concerning for infection.  Discussed that given he is only 6 weeks postop, only consideration for an aspiration would likely be if his exam was concerning for developing infection.  Patient is in understanding of this and we discussed options including occasional ibuprofen and Voltaren for inflammation.  At last visit with Dr. Benjiman office earlier this week, he was recommended for a 6-week follow-up.  He will continue with this but advised on sooner evaluation if symptoms worsen.  Plan :    - Continue PT and following with Dr. Jerri      I personally saw and evaluated the patient, and participated in the management and treatment plan.  Leonce Reveal, PA-C Orthopedics    [1] No Known Allergies  "

## 2024-06-22 NOTE — Therapy (Incomplete)
 " OUTPATIENT PHYSICAL THERAPY LOWER EXTREMITY TREATMENT  Patient Name: Alex Holt MRN: [id number] DOB:Sep 08, 1965, 59 y.o., male Today's Date: 06/22/2024        Past Medical History:  Diagnosis Date   Acid reflux    Arthritis    Diabetes mellitus without complication (HCC)    Hyperlipidemia    Hypertension    Schizophrenia (HCC)    Past Surgical History:  Procedure Laterality Date   NO PAST SURGERIES     TOTAL KNEE ARTHROPLASTY Left 05/04/2024   Procedure: ARTHROPLASTY, KNEE, TOTAL;  Surgeon: Jerri Kay HERO, MD;  Location: [facility] OR;  Service: Orthopedics;  Laterality: Left;   Patient Active Problem List   Diagnosis Date Noted   Status post total left knee replacement 05/04/2024   Primary osteoarthritis of left knee 01/25/2022   Allergic rhinitis 12/23/2013   Flea bite of multiple sites 12/23/2013   Left ankle sprain 08/17/2013   CTS (carpal tunnel syndrome) 04/29/2012   Plantar fasciitis 04/10/2012   Insomnia 11/14/2010   Type 2 diabetes mellitus in patient with obesity (HCC) 11/14/2010   Chronic mental illness 09/25/2010   ANEMIA 03/08/2010   GERD 03/08/2010   HYPERLIPIDEMIA 01/30/2010   OBESITY 01/30/2010   Essential hypertension 01/30/2010    PCP: Myra Kawasaki, NP (Inactive)  REFERRING PROVIDER: Jerri Kay HERO, MD  THERAPY DIAG:  No diagnosis found.  REFERRING DIAG: Primary osteoarthritis of left knee [M17.12]   Rationale for Evaluation and Treatment:  Rehabilitation  SUBJECTIVE:  PERTINENT PAST HISTORY:  DM TII, insomnia, schizophrenia        PRECAUTIONS: None  WEIGHT BEARING RESTRICTIONS No  FALLS:  Has patient fallen in last 6 months? No, Number of falls: 0  MOI/History of condition:  Onset date: 05/04/2024  SUBJECTIVE STATEMENT Pt he is doing well today.   EVAL: Pt is a 59 y.o. male who presents to clinic with chief complaint of L knee pain and stiffness following L TKA 05/04/2024.  Pain in bil knees for about 1 year.  Been doing ok but  quite a bit of pain.    Pain:  Are you having pain? Yes Pain location: L knee pain NPRS scale: Current: 5/10. R 2/10 Best: 4/10, Worst: 8/10 Aggravating factors: walking, standing Relieving factors: rest Pain description: sharp and aching  Occupation: washes cars  Assistive Device: FWW  Hand Dominance: na  Patient Goals/Specific Activities: reduce pain and improve function    OBJECTIVE:   GENERAL OBSERVATION/GAIT: Slow antalgic gait with reduced time in stance on L  PALPATION: Mod swelling about the L knee  LE MMT:  MMT Right (Eval) Left (Eval)  Hip flexion (L2, L3) 4 3  Knee extension (L3) 4 3  Knee flexion 4 3  Hip abduction    Hip extension    Hip external rotation    Hip internal rotation    Hip adduction    Ankle dorsiflexion (L4)    Ankle plantarflexion (S1)    Ankle inversion    Ankle eversion    Great Toe ext (L5)    Grossly     (Blank rows = not tested, score listed is out of 5 possible points.  N = WNL, D = diminished, C = clear for gross weakness with myotome testing, * = concordant pain with testing)  LE ROM:  ROM Right (Eval) Left (Eval) Lt Lt 06/03/24 Lt  Lt 06/17/24  Hip flexion        Hip extension        Hip abduction  Hip adduction        Hip internal rotation        Hip external rotation        Knee extension n Lacking 11 Lacking 5 Lacking 3 Lacking 3   Knee flexion n 70 85 90 95 AAROM 102 AROM  Ankle dorsiflexion        Ankle plantarflexion        Ankle inversion        Ankle eversion         (Blank rows = not tested, N = WNL, * = concordant pain with testing)  Functional Tests  Eval                                                              PATIENT SURVEYS:  LEFS: 21/80  HOME EXERCISE PROGRAM: Access Code: [email or web address] URL: [email or web address] Date: 06/11/2024 Prepared by: Dasie Daft  Exercises - Supine Ankle Pumps  - 8 x daily - 7 x weekly - 2 sets - 10 reps - Supine Heel  Slide with Strap  - 1 x daily - 7 x weekly - 3 sets - 10 reps - Seated Hamstring Stretch  - 2 x daily - 7 x weekly - 1 sets - 3 reps - 45 hold - Active Straight Leg Raise with Quad Set  - 1 x daily - 7 x weekly - 3 sets - 10 reps - Supine Quad Set  - 5 x daily - 7 x weekly - 2 sets - 10 reps - 10 hold - Ice  - 5 x daily - 7 x weekly - 1 sets - 1 reps - 20 min hold - Seated Long Arc Quad  - 1 x daily - 7 x weekly - 2 sets - 10 reps - 3 hold - Sit to Stand Without Arm Support  - 1 x daily - 7 x weekly - 2 sets - 10 reps - Heel Toe Raises with Counter Support  - 1 x daily - 7 x weekly - 2 sets - 10 reps - 2 hold - Sidelying Hip Abduction  - 1 x daily - 7 x weekly - 3 sets - 10 reps - 3 hold - Clamshell with Resistance  - 1 x daily - 7 x weekly - 3 sets - 10 reps - 3 hold - Seated Heel Slide  - 2 x daily - 7 x weekly - 1 sets - 3 reps - 60 hold  Treatment priorities   Eval        Ext and flexion ROM        Gait, balance                                  TODAY'S TREATMENT: [facility] Adult PT Treatment:                                                DATE: 06/23/34 Rec bike L1 5 mins for L knee ROM Tandem balance standing  SL balance standing L Standing hip abd 2x20x3s 5# Standing hip ext 2x20x3s 5# Alt Side  steps over low hurdles Alt FWD steps over low hurdles Omega L knee ext 3x8x3s 5# Omega L knee flex 3x8x3s 15# Gait training with SPC Therapeutic Exercise: *** Manual Therapy: *** Neuromuscular re-ed: *** Therapeutic Activity: *** Modalities: *** Self Care: ***  [facility] Adult PT Treatment:                                                DATE: 06/17/24 Rec bike L1 5 mins for L knee ROM Tandem balance standing  SL balance standing L Standing hip abd 2x20x3s 5# Standing hip ext 2x20x3s 5# Alt Side steps over low hurdles Alt FWD steps over low hurdles Omega L knee ext 3x8x3s 5# Omega L knee flex 3x8x3s 15# Gait training with Operating Room Services  [facility] Adult PT Treatment:                                                 DATE: 06/17/24 Therapeutic Exercise/Activities: Supine Heel Slide s and c Strap x10, x5 30 Supine Quad Set  x10 10 AA SLR c QS x10 LAQ 3x10 5# S/L hip abd 2x10 5# S/L clams 3x10 BluTB  Forward step ups/backs 2x10 L Lateral step ups/lowers 2x10 L  PATIENT EDUCATION ([place]/HM):  POC, diagnosis, prognosis, HEP, and outcome measures.  Pt educated via explanation, demonstration, and handout (HEP).  Pt confirms understanding verbally.   ASSESSMENT: CLINICAL IMPRESSION: Pt was completed for gait training, balance, and strengthening. With gait training using a SPC, pt completed with proper technique following verbal cueing. Pt is OK to use the [facility] for community mobility. With stepping over a low hurdle with the L LE, some hip abd compensation as needed to clear the hurdle with L knee flexion limitation (currently 102 degrees). Pt will continue to benefit from skilled PT to address impairments for improved L knee/LE function for Ind mobility.   EVAL: Abdurrahman is a 59 y.o. male who presents to clinic with signs and sxs consistent with L knee pain and stiffness following L TKA on 12/15.  Doing fair overall with expected pain and ROM limitations.   Diontre will benefit from skilled PT to address relevant deficits to achieve independence with daily activities.   OBJECTIVE IMPAIRMENTS: Pain, L knee ROM, L knee and hip strength, gait, balance  ACTIVITY LIMITATIONS: standing, walking, squatting, work  PERSONAL FACTORS: See medical history and pertinent history   REHAB POTENTIAL: Good  CLINICAL DECISION MAKING: Evolving/moderate complexity  EVALUATION COMPLEXITY: Moderate   GOALS:   SHORT TERM GOALS: Target date: 06/22/2024   Keanu will be >75% HEP compliant to improve carryover between sessions and facilitate independent management of condition  Evaluation: ongoing Goal status: MET    LONG TERM GOALS: Target date: 07/20/2024   Bertram will self report >/= 50% decrease in  pain from evaluation to improve function in daily tasks  Evaluation/Baseline: 8/10 max pain Goal status: INITIAL   2.  Gershon will show a >/= 27 pt improvement in LEFS score (MCID is ~11% or 9 pts) as a proxy for functional improvement   Evaluation/Baseline: 21 pts Goal status: INITIAL   3.  Bocephus will be able to return to work, not limited by pain  Evaluation/Baseline: limited Goal status: INITIAL   4.  Magic will achieve >/=115 degrees knee flexion to improve ability to complete transfers, squat, and navigate steps  Evaluation/Baseline: 70 degrees Goal status: INITIAL   5.  Aleric will achieve </=3 degrees knee extension to improve mechanics of gait  Evaluation/Baseline: 11 degrees Goal status: INITIAL    PLAN: PT FREQUENCY: 1-2x/week  PT DURATION: 8 weeks  PLANNED INTERVENTIONS:  [id number]- PT Re-evaluation, 97110-Therapeutic exercises, 97530- Therapeutic activity, 97112- Neuromuscular re-education, 97535- Self Care, [id number]- Manual therapy, [id number]- Gait training, [id number]- Aquatic Therapy, [phone]- Electrical stimulation (manual), [id number]- Vasopneumatic device, [id number]- Traction (mechanical), [id number]- Ionotophoresis 4mg /ml Dexamethasone , Taping, Dry Needling, Joint manipulation, and Spinal manipulation.  Cyndra Feinberg MS, PT 06/22/24 9:05 PM   "

## 2024-06-23 ENCOUNTER — Ambulatory Visit

## 2024-06-23 ENCOUNTER — Telehealth: Payer: Self-pay

## 2024-06-23 NOTE — Telephone Encounter (Signed)
 Attempted to contact pt re: 1st no show appt. There was no answer and his voice mail was full, so a message was not able to be left.

## 2024-06-25 NOTE — Therapy (Incomplete)
 " OUTPATIENT PHYSICAL THERAPY LOWER EXTREMITY TREATMENT  Patient Name: Alex Holt MRN: [id number] DOB:[birth year], 59 y.o., male Today's Date: 06/25/2024        Past Medical History:  Diagnosis Date   Acid reflux    Arthritis    Diabetes mellitus without complication (HCC)    Hyperlipidemia    Hypertension    Schizophrenia (HCC)    Past Surgical History:  Procedure Laterality Date   NO PAST SURGERIES     TOTAL KNEE ARTHROPLASTY Left 05/04/2024   Procedure: ARTHROPLASTY, KNEE, TOTAL;  Surgeon: Jerri Kay HERO, MD;  Location: [facility] OR;  Service: Orthopedics;  Laterality: Left;   Patient Active Problem List   Diagnosis Date Noted   Status post total left knee replacement 05/04/2024   Primary osteoarthritis of left knee 01/25/2022   Allergic rhinitis 12/23/2013   Flea bite of multiple sites 12/23/2013   Left ankle sprain 08/17/2013   CTS (carpal tunnel syndrome) 04/29/2012   Plantar fasciitis 04/10/2012   Insomnia 11/14/2010   Type 2 diabetes mellitus in patient with obesity (HCC) 11/14/2010   Chronic mental illness 09/25/2010   ANEMIA 03/08/2010   GERD 03/08/2010   HYPERLIPIDEMIA 01/30/2010   OBESITY 01/30/2010   Essential hypertension 01/30/2010    PCP: Myra Kawasaki, NP (Inactive)  REFERRING PROVIDER: Jerri Kay HERO, MD  THERAPY DIAG:  No diagnosis found.  REFERRING DIAG: Primary osteoarthritis of left knee [M17.12]   Rationale for Evaluation and Treatment:  Rehabilitation  SUBJECTIVE:  PERTINENT PAST HISTORY:  DM TII, insomnia, schizophrenia        PRECAUTIONS: None  WEIGHT BEARING RESTRICTIONS No  FALLS:  Has patient fallen in last 6 months? No, Number of falls: 0  MOI/History of condition:  Onset date: 05/04/2024  SUBJECTIVE STATEMENT Pt he is doing well today.   EVAL: Pt is a 59 y.o. male who presents to clinic with chief complaint of L knee pain and stiffness following L TKA 05/04/2024.  Pain in bil knees for about 1 year.  Been doing ok but  quite a bit of pain.    Pain:  Are you having pain? Yes Pain location: L knee pain NPRS scale: Current: 5/10. R 2/10 Best: 4/10, Worst: 8/10 Aggravating factors: walking, standing Relieving factors: rest Pain description: sharp and aching  Occupation: washes cars  Assistive Device: FWW  Hand Dominance: na  Patient Goals/Specific Activities: reduce pain and improve function    OBJECTIVE:   GENERAL OBSERVATION/GAIT: Slow antalgic gait with reduced time in stance on L  PALPATION: Mod swelling about the L knee  LE MMT:  MMT Right (Eval) Left (Eval)  Hip flexion (L2, L3) 4 3  Knee extension (L3) 4 3  Knee flexion 4 3  Hip abduction    Hip extension    Hip external rotation    Hip internal rotation    Hip adduction    Ankle dorsiflexion (L4)    Ankle plantarflexion (S1)    Ankle inversion    Ankle eversion    Great Toe ext (L5)    Grossly     (Blank rows = not tested, score listed is out of 5 possible points.  N = WNL, D = diminished, C = clear for gross weakness with myotome testing, * = concordant pain with testing)  LE ROM:  ROM Right (Eval) Left (Eval) Lt Lt 06/03/24 Lt  Lt 06/17/24  Hip flexion        Hip extension        Hip abduction  Hip adduction        Hip internal rotation        Hip external rotation        Knee extension n Lacking 11 Lacking 5 Lacking 3 Lacking 3   Knee flexion n 70 85 90 95 AAROM 102 AROM  Ankle dorsiflexion        Ankle plantarflexion        Ankle inversion        Ankle eversion         (Blank rows = not tested, N = WNL, * = concordant pain with testing)  Functional Tests  Eval                                                              PATIENT SURVEYS:  LEFS: 21/80  HOME EXERCISE PROGRAM: Access Code: [email or web address] URL: [email or web address] Date: 06/11/2024 Prepared by: Dasie Daft  Exercises - Supine Ankle Pumps  - 8 x daily - 7 x weekly - 2 sets - 10 reps - Supine Heel  Slide with Strap  - 1 x daily - 7 x weekly - 3 sets - 10 reps - Seated Hamstring Stretch  - 2 x daily - 7 x weekly - 1 sets - 3 reps - 45 hold - Active Straight Leg Raise with Quad Set  - 1 x daily - 7 x weekly - 3 sets - 10 reps - Supine Quad Set  - 5 x daily - 7 x weekly - 2 sets - 10 reps - 10 hold - Ice  - 5 x daily - 7 x weekly - 1 sets - 1 reps - 20 min hold - Seated Long Arc Quad  - 1 x daily - 7 x weekly - 2 sets - 10 reps - 3 hold - Sit to Stand Without Arm Support  - 1 x daily - 7 x weekly - 2 sets - 10 reps - Heel Toe Raises with Counter Support  - 1 x daily - 7 x weekly - 2 sets - 10 reps - 2 hold - Sidelying Hip Abduction  - 1 x daily - 7 x weekly - 3 sets - 10 reps - 3 hold - Clamshell with Resistance  - 1 x daily - 7 x weekly - 3 sets - 10 reps - 3 hold - Seated Heel Slide  - 2 x daily - 7 x weekly - 1 sets - 3 reps - 60 hold  Treatment priorities   Eval        Ext and flexion ROM        Gait, balance                                  TODAY'S TREATMENT: [facility] Adult PT Treatment:                                                DATE: 06/26/34 Rec bike L1 5 mins for L knee ROM Tandem balance standing  SL balance standing L Standing hip abd 2x20x3s 5# Standing hip ext 2x20x3s 5# Alt Side  steps over low hurdles Alt FWD steps over low hurdles Omega L knee ext 3x8x3s 5# Omega L knee flex 3x8x3s 15# Gait training with SPC Therapeutic Exercise: *** Manual Therapy: *** Neuromuscular re-ed: *** Therapeutic Activity: *** Modalities: *** Self Care: ***  [facility] Adult PT Treatment:                                                DATE: 06/17/24 Rec bike L1 5 mins for L knee ROM Tandem balance standing  SL balance standing L Standing hip abd 2x20x3s 5# Standing hip ext 2x20x3s 5# Alt Side steps over low hurdles Alt FWD steps over low hurdles Omega L knee ext 3x8x3s 5# Omega L knee flex 3x8x3s 15# Gait training with [facility]  [facility] Adult PT Treatment:                                                 DATE: 06/17/24 Therapeutic Exercise/Activities: Supine Heel Slide s and c Strap x10, x5 30 Supine Quad Set  x10 10 AA SLR c QS x10 LAQ 3x10 5# S/L hip abd 2x10 5# S/L clams 3x10 BluTB  Forward step ups/backs 2x10 L Lateral step ups/lowers 2x10 L  PATIENT EDUCATION ([place]/[facility]):  POC, diagnosis, prognosis, HEP, and outcome measures.  Pt educated via explanation, demonstration, and handout (HEP).  Pt confirms understanding verbally.   ASSESSMENT: CLINICAL IMPRESSION: Pt was completed for gait training, balance, and strengthening. With gait training using a SPC, pt completed with proper technique following verbal cueing. Pt is OK to use the [facility] for community mobility. With stepping over a low hurdle with the L LE, some hip abd compensation as needed to clear the hurdle with L knee flexion limitation (currently 102 degrees). Pt will continue to benefit from skilled PT to address impairments for improved L knee/LE function for Ind mobility.   EVAL: Levis is a 59 y.o. male who presents to clinic with signs and sxs consistent with L knee pain and stiffness following L TKA on 12/15.  Doing fair overall with expected pain and ROM limitations.   Ahmaud will benefit from skilled PT to address relevant deficits to achieve independence with daily activities.   OBJECTIVE IMPAIRMENTS: Pain, L knee ROM, L knee and hip strength, gait, balance  ACTIVITY LIMITATIONS: standing, walking, squatting, work  PERSONAL FACTORS: See medical history and pertinent history   REHAB POTENTIAL: Good  CLINICAL DECISION MAKING: Evolving/moderate complexity  EVALUATION COMPLEXITY: Moderate   GOALS:   SHORT TERM GOALS: Target date: 06/22/2024   Laderius will be >75% HEP compliant to improve carryover between sessions and facilitate independent management of condition  Evaluation: ongoing Goal status: MET    LONG TERM GOALS: Target date: 07/20/2024   Melbert will self report >/= 50% decrease in  pain from evaluation to improve function in daily tasks  Evaluation/Baseline: 8/10 max pain Goal status: INITIAL   2.  Giani will show a >/= 27 pt improvement in LEFS score (MCID is ~11% or 9 pts) as a proxy for functional improvement   Evaluation/Baseline: 21 pts Goal status: INITIAL   3.  Marshon will be able to return to work, not limited by pain  Evaluation/Baseline: limited Goal status: INITIAL   4.  Kamareon will achieve >/=115 degrees knee flexion to improve ability to complete transfers, squat, and navigate steps  Evaluation/Baseline: 70 degrees Goal status: INITIAL   5.  Eirik will achieve </=3 degrees knee extension to improve mechanics of gait  Evaluation/Baseline: 11 degrees Goal status: INITIAL    PLAN: PT FREQUENCY: 1-2x/week  PT DURATION: 8 weeks  PLANNED INTERVENTIONS:  [id number]- PT Re-evaluation, 97110-Therapeutic exercises, 97530- Therapeutic activity, 97112- Neuromuscular re-education, 97535- Self Care, [id number]- Manual therapy, [id number]- Gait training, [id number]- Aquatic Therapy, [id number]- Electrical stimulation (manual), [id number]- Vasopneumatic device, [id number]- Traction (mechanical), [id number]- Ionotophoresis 4mg /ml Dexamethasone , Taping, Dry Needling, Joint manipulation, and Spinal manipulation.  Ayssa Bentivegna MS, PT 06/25/24 5:17 PM   "

## 2024-06-26 ENCOUNTER — Telehealth: Payer: Self-pay

## 2024-06-26 ENCOUNTER — Ambulatory Visit

## 2024-06-26 NOTE — Telephone Encounter (Signed)
 Attempted to contact pt re: 2nd no show appt. Pt may reschedule 1 appt at a time.

## 2024-07-27 ENCOUNTER — Ambulatory Visit: Admitting: Podiatry
